# Patient Record
Sex: Female | Born: 1940
Health system: Southern US, Community
[De-identification: ages and names within clinical notes are randomized; demographics above are authoritative.]

## PROBLEM LIST (undated history)

## (undated) DIAGNOSIS — Z8701 Personal history of pneumonia (recurrent): Secondary | ICD-10-CM

## (undated) DIAGNOSIS — I10 Essential (primary) hypertension: Secondary | ICD-10-CM

## (undated) DIAGNOSIS — E559 Vitamin D deficiency, unspecified: Secondary | ICD-10-CM

## (undated) DIAGNOSIS — M8580 Other specified disorders of bone density and structure, unspecified site: Secondary | ICD-10-CM

## (undated) DIAGNOSIS — J45909 Unspecified asthma, uncomplicated: Secondary | ICD-10-CM

## (undated) DIAGNOSIS — G4700 Insomnia, unspecified: Secondary | ICD-10-CM

## (undated) DIAGNOSIS — N289 Disorder of kidney and ureter, unspecified: Secondary | ICD-10-CM

## (undated) DIAGNOSIS — M109 Gout, unspecified: Secondary | ICD-10-CM

## (undated) DIAGNOSIS — E7800 Pure hypercholesterolemia, unspecified: Secondary | ICD-10-CM

## (undated) DIAGNOSIS — G609 Hereditary and idiopathic neuropathy, unspecified: Secondary | ICD-10-CM

## (undated) HISTORY — DX: Gout, unspecified: M10.9

## (undated) HISTORY — DX: Vitamin D deficiency, unspecified: E55.9

## (undated) HISTORY — DX: Pure hypercholesterolemia, unspecified: E78.00

## (undated) HISTORY — DX: Personal history of pneumonia (recurrent): Z87.01

## (undated) HISTORY — PX: APPENDECTOMY: SHX54

## (undated) HISTORY — DX: Hereditary and idiopathic neuropathy, unspecified: G60.9

## (undated) HISTORY — DX: Essential (primary) hypertension: I10

## (undated) HISTORY — DX: Disorder of kidney and ureter, unspecified: N28.9

## (undated) HISTORY — DX: Unspecified asthma, uncomplicated: J45.909

## (undated) HISTORY — PX: TONSILLECTOMY: SUR1361

## (undated) HISTORY — DX: Other specified disorders of bone density and structure, unspecified site: M85.80

## (undated) HISTORY — DX: Insomnia, unspecified: G47.00

## (undated) HISTORY — PX: VESICOVAGINAL FISTULA CLOSURE W/ TAH: SUR271

---

## 2013-09-30 ENCOUNTER — Telehealth: Payer: Self-pay | Admitting: Critical Care Medicine

## 2013-09-30 NOTE — Telephone Encounter (Signed)
Pt has an appt w/ PW in [place] for 2/24 @ 3:45 PM.  Pt was referred by Dr. Lovette Cliche for Ongoing Upper Resp Probs.  Nothing further needed.  Nichole Gray

## 2013-10-03 ENCOUNTER — Institutional Professional Consult (permissible substitution): Payer: Self-pay | Admitting: Internal Medicine

## 2013-11-21 ENCOUNTER — Encounter: Payer: Self-pay | Admitting: Critical Care Medicine

## 2013-11-22 ENCOUNTER — Encounter: Payer: Self-pay | Admitting: Critical Care Medicine

## 2013-11-22 ENCOUNTER — Ambulatory Visit (INDEPENDENT_AMBULATORY_CARE_PROVIDER_SITE_OTHER): Payer: Self-pay | Admitting: Critical Care Medicine

## 2013-11-22 VITALS — BP 134/66 | HR 95 | Ht 65.0 in | Wt 137.2 lb

## 2013-11-22 DIAGNOSIS — M8580 Other specified disorders of bone density and structure, unspecified site: Secondary | ICD-10-CM | POA: Insufficient documentation

## 2013-11-22 DIAGNOSIS — R059 Cough, unspecified: Secondary | ICD-10-CM

## 2013-11-22 DIAGNOSIS — R918 Other nonspecific abnormal finding of lung field: Secondary | ICD-10-CM

## 2013-11-22 DIAGNOSIS — A310 Pulmonary mycobacterial infection: Secondary | ICD-10-CM

## 2013-11-22 DIAGNOSIS — E559 Vitamin D deficiency, unspecified: Secondary | ICD-10-CM | POA: Insufficient documentation

## 2013-11-22 DIAGNOSIS — R05 Cough: Secondary | ICD-10-CM

## 2013-11-22 DIAGNOSIS — N289 Disorder of kidney and ureter, unspecified: Secondary | ICD-10-CM | POA: Insufficient documentation

## 2013-11-22 DIAGNOSIS — I10 Essential (primary) hypertension: Secondary | ICD-10-CM | POA: Insufficient documentation

## 2013-11-22 DIAGNOSIS — E7800 Pure hypercholesterolemia, unspecified: Secondary | ICD-10-CM | POA: Insufficient documentation

## 2013-11-22 DIAGNOSIS — G609 Hereditary and idiopathic neuropathy, unspecified: Secondary | ICD-10-CM | POA: Insufficient documentation

## 2013-11-22 MED ORDER — ETHAMBUTOL HCL 400 MG PO TABS: ORAL_TABLET | ORAL | Status: DC

## 2013-11-22 MED ORDER — RIFAMPIN 300 MG PO CAPS: ORAL_CAPSULE | ORAL | Status: DC

## 2013-11-22 MED ORDER — CLARITHROMYCIN 500 MG PO TABS: ORAL_TABLET | ORAL | Status: DC

## 2013-11-22 NOTE — Patient Instructions (Signed)
Start Biaxin 500mg  Two three times weekly Start Rifabutin 300mg  Two three times weekly Start myambutol 400mg  Three three times weekly Return 6 weeks for recheck

## 2013-11-22 NOTE — Progress Notes (Signed)
Subjective:    Patient ID: Nichole Gray, female    DOB: [birth year], 73 y.o.   MRN: [id number]  HPI Comments: Chronic cough for two years.  Dx PNA, Flu, bronchitis.  Pt coughs up daily, this am particularly worse Notes dyspnea with any exertion.  Smoker 24yrs ago  Cough This is a chronic problem. The current episode started more than 1 year ago. The problem has been gradually worsening. The cough is productive of sputum (white to yellow mucus). Associated symptoms include chills, headaches, heartburn, hemoptysis, nasal congestion, postnasal drip, rhinorrhea and shortness of breath. Pertinent negatives include no chest pain, ear congestion, ear pain, fever, myalgias, rash, sore throat, sweats, weight loss or wheezing. The symptoms are aggravated by fumes, dust and lying down. She has tried a beta-agonist inhaler for the symptoms. The treatment provided no relief. Her past medical history is significant for asthma, bronchitis, environmental allergies and pneumonia. There is no history of bronchiectasis, COPD or emphysema.    Past Medical History  Diagnosis Date  . Elevated cholesterol   . Idiopathic neuropathy   . History of pneumonia   . Osteopenia   . Insomnia   . Gout   . Benign essential hypertension   . Renal insufficiency   . Vitamin D insufficiency   . Asthma      Family History  Problem Relation Age of Onset  . Coronary artery disease Mother   . Stroke    . Brain cancer Father   . Emphysema Father   . Rheum arthritis Sister   . Diabetes    . Heart disease Sister   . Breast cancer Sister      History   Social History  . Marital Status: N/A    Spouse Name: N/A    Number of Children: N/A  . Years of Education: N/A   Occupational History  . Not on file.   Social History Main Topics  . Smoking status: Former Smoker -- 0.50 packs/day for 10 years    Types: Cigarettes    Quit date: 08/25/1994  . Smokeless tobacco: Never Used  . Alcohol Use: No  . Drug Use: No   . Sexual Activity: Not on file   Other Topics Concern  . Not on file   Social History Narrative  . No narrative on file     Allergies  Allergen Reactions  . Codeine     Nausea, vomiting  . Hydrocodone     vomting  . Penicillins     Rash, itch  . Prednisone     Can tolerate medication but doesn't like taking it     Outpatient Prescriptions Prior to Visit  Medication Sig Dispense Refill  . allopurinol (ZYLOPRIM) 100 MG tablet Take 100 mg by mouth 2 (two) times daily.      Marland Kitchen estradiol (ESTRACE) 1 MG tablet Take 1 mg by mouth daily.      Marland Kitchen losartan (COZAAR) 50 MG tablet Take 50 mg by mouth daily.      Marland Kitchen omeprazole (PRILOSEC) 20 MG capsule Take 20 mg by mouth 2 (two) times daily.        No facility-administered medications prior to visit.      Review of Systems  Constitutional: Positive for chills and activity change. Negative for fever, weight loss, diaphoresis, appetite change, fatigue and unexpected weight change.  HENT: Positive for congestion, postnasal drip, rhinorrhea, sinus pressure, sneezing, trouble swallowing and voice change. Negative for dental problem, ear discharge, ear pain, facial swelling, hearing  loss, mouth sores, nosebleeds, sore throat and tinnitus.   Eyes: Negative for photophobia, discharge, itching and visual disturbance.  Respiratory: Positive for cough, hemoptysis and shortness of breath. Negative for apnea, choking, chest tightness, wheezing and stridor.   Cardiovascular: Positive for palpitations and leg swelling. Negative for chest pain.  Gastrointestinal: Positive for heartburn. Negative for nausea, vomiting, abdominal pain, constipation, blood in stool and abdominal distention.       Has heartburn daily  Genitourinary: Positive for frequency. Negative for dysuria, urgency, hematuria, flank pain, decreased urine volume and difficulty urinating.  Musculoskeletal: Positive for back pain and gait problem. Negative for arthralgias, joint swelling,  myalgias, neck pain and neck stiffness.  Skin: Negative for color change, pallor and rash.  Allergic/Immunologic: Positive for environmental allergies.  Neurological: Positive for numbness and headaches. Negative for dizziness, tremors, seizures, syncope, speech difficulty, weakness and light-headedness.  Hematological: Negative for adenopathy. Bruises/bleeds easily.  Psychiatric/Behavioral: Positive for sleep disturbance. Negative for confusion and agitation. The patient is not nervous/anxious.        Objective:   Physical Exam Filed Vitals:   11/22/13 1409  BP: 134/66  Pulse: 95  Height: 5\' 5"  (1.651 m)  Weight: 137 lb 3.2 oz (62.234 kg)  SpO2: 98%    Gen: Pleasant, well-nourished, in no distress,  normal affect  ENT: No lesions,  mouth clear,  oropharynx clear, no postnasal drip  Neck: No JVD, no TMG, no carotid bruits  Lungs: No use of accessory muscles, no dullness to percussion, scattered upper lobe rhonchi  Cardiovascular: RRR, heart sounds normal, no murmur or gallops, no peripheral edema  Abdomen: soft and NT, no HSM,  BS normal  Musculoskeletal: No deformities, no cyanosis or clubbing  Neuro: alert, non focal  Skin: Warm, no lesions or rashes  No results found. CT scan and all records from Dr chodri/hosp reviewed      Assessment & Plan:   Gout MAI on FOB from 08/2013.  Chronic upper lobe bilateral infiltrates with tree in bud pattern Pt is symptomatic and needs Rx Plan Start Biaxin 500mg  Two three times weekly Start Rifabutin 300mg  Two three times weekly Start myambutol 400mg  Three three times weekly Return 6 weeks for recheck    Cough Chronic cough d/t MAI pulm infection See MAI Rec   Updated Medication List Outpatient Encounter Prescriptions as of 11/22/2013  Medication Sig  . albuterol (PROVENTIL HFA;VENTOLIN HFA) 108 (90 BASE) MCG/ACT inhaler Inhale 2 puffs into the lungs 2 (two) times daily as needed for wheezing or shortness of breath.  .  allopurinol (ZYLOPRIM) 100 MG tablet Take 100 mg by mouth 2 (two) times daily.  Marland Kitchen estradiol (ESTRACE) 1 MG tablet Take 1 mg by mouth daily.  Marland Kitchen losartan (COZAAR) 50 MG tablet Take 50 mg by mouth daily.  Marland Kitchen omeprazole (PRILOSEC) 20 MG capsule Take 20 mg by mouth 2 (two) times daily.   . clarithromycin (BIAXIN) 500 MG tablet Two tablets three times weekly  . ethambutol (MYAMBUTOL) 400 MG tablet Three tablets three times weekly  . rifampin (RIFADIN) 300 MG capsule Take two capsules three times weekly

## 2013-11-24 NOTE — Assessment & Plan Note (Signed)
Chronic cough d/t MAI pulm infection See MAI Rec

## 2013-11-24 NOTE — Assessment & Plan Note (Signed)
MAI on FOB from 08/2013.  Chronic upper lobe bilateral infiltrates with tree in bud pattern Pt is symptomatic and needs Rx Plan Start Biaxin 500mg  Two three times weekly Start Rifabutin 300mg  Two three times weekly Start myambutol 400mg  Three three times weekly Return 6 weeks for recheck

## 2013-12-27 ENCOUNTER — Ambulatory Visit (INDEPENDENT_AMBULATORY_CARE_PROVIDER_SITE_OTHER): Payer: Self-pay | Admitting: Critical Care Medicine

## 2013-12-27 ENCOUNTER — Encounter: Payer: Self-pay | Admitting: Critical Care Medicine

## 2013-12-27 VITALS — BP 128/62 | HR 90 | Temp 97.6°F | Ht 65.5 in | Wt 137.0 lb

## 2013-12-27 DIAGNOSIS — R05 Cough: Secondary | ICD-10-CM

## 2013-12-27 DIAGNOSIS — R059 Cough, unspecified: Secondary | ICD-10-CM

## 2013-12-27 DIAGNOSIS — T50905S Adverse effect of unspecified drugs, medicaments and biological substances, sequela: Secondary | ICD-10-CM

## 2013-12-27 DIAGNOSIS — J479 Bronchiectasis, uncomplicated: Secondary | ICD-10-CM

## 2013-12-27 DIAGNOSIS — A310 Pulmonary mycobacterial infection: Secondary | ICD-10-CM

## 2013-12-27 NOTE — Assessment & Plan Note (Signed)
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

## 2013-12-27 NOTE — Assessment & Plan Note (Signed)
Cough on the basis of Mycobacterium infection now improved Monitor

## 2013-12-27 NOTE — Patient Instructions (Signed)
Stay on ethambutol/rifampin/biaxin as prescribed Get an eye exam to check retina/color vision.  Tell Eye doctor you are on Ethambutol Liver functions today Return 3 months Plan to stay on antibiotics minimum 15months

## 2013-12-27 NOTE — Progress Notes (Signed)
Subjective:    Patient ID: Nichole Gray, female    DOB: Sep 01, 1940, 73 y.o.   MRN: [id number]  HPI 12/27/2013 Chief Complaint  Patient presents with  . Follow-up    Feeling better , cough-white,no wheezing, denies cp or tightness,chills   At the last OV we Dx MAI lung, pos fob 08/2013 Rx rifabutin/Ethambutol/Biaxin Cough is now better.  Only early AM will cough.  tol meds well so far. Needs LFTs today No hemoptysis.  No chest pain or fever. Pt denies any significant sore throat, nasal congestion or excess secretions, fever, chills, sweats, unintended weight loss, pleurtic or exertional chest pain, orthopnea PND, or leg swelling Pt denies any increase in rescue therapy over baseline, denies waking up needing it or having any early am or nocturnal exacerbations of coughing/wheezing/or dyspnea. Pt also denies any obvious fluctuation in symptoms with  weather or environmental change or other alleviating or aggravating factors   Review of Systems  Constitutional: Positive for activity change. Negative for diaphoresis, appetite change, fatigue and unexpected weight change.  HENT: Positive for congestion, sinus pressure, sneezing, trouble swallowing and voice change. Negative for dental problem, ear discharge, facial swelling, hearing loss, mouth sores, nosebleeds and tinnitus.   Eyes: Negative for photophobia, discharge, itching and visual disturbance.  Respiratory: Negative for apnea, choking, chest tightness and stridor.   Cardiovascular: Positive for palpitations and leg swelling.  Gastrointestinal: Negative for nausea, vomiting, abdominal pain, constipation, blood in stool and abdominal distention.       Has heartburn daily  Genitourinary: Positive for frequency. Negative for dysuria, urgency, hematuria, flank pain, decreased urine volume and difficulty urinating.  Musculoskeletal: Positive for back pain and gait problem. Negative for arthralgias, joint swelling, neck pain and neck  stiffness.  Skin: Negative for color change and pallor.  Neurological: Positive for numbness. Negative for dizziness, tremors, seizures, syncope, speech difficulty, weakness and light-headedness.  Hematological: Negative for adenopathy. Bruises/bleeds easily.  Psychiatric/Behavioral: Positive for sleep disturbance. Negative for confusion and agitation. The patient is not nervous/anxious.        Objective:   Physical Exam  Filed Vitals:   12/27/13 1106  BP: 128/62  Pulse: 90  Temp: 97.6 F (36.4 C)  TempSrc: Oral  Height: 5' 5.5" (1.664 m)  Weight: 137 lb (62.143 kg)  SpO2: 98%    Gen: Pleasant, well-nourished, in no distress,  normal affect  ENT: No lesions,  mouth clear,  oropharynx clear, no postnasal drip  Neck: No JVD, no TMG, no carotid bruits  Lungs: No use of accessory muscles, no dullness to percussion, improved scattered upper lobe rhonchi  Cardiovascular: RRR, heart sounds normal, no murmur or gallops, no peripheral edema  Abdomen: soft and NT, no HSM,  BS normal  Musculoskeletal: No deformities, no cyanosis or clubbing  Neuro: alert, non focal  Skin: Warm, no lesions or rashes  No results found.     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 allopurinol (ZYLOPRIM) 100 MG tablet Take 100 mg by mouth 2 (two) times daily.  . clarithromycin (BIAXIN) 500 MG tablet Two tablets three times weekly  . estradiol (ESTRACE) 1 MG tablet Take 1  mg by mouth daily.  Marland Kitchen ethambutol (MYAMBUTOL) 400 MG tablet Three tablets three times weekly  . losartan  (COZAAR) 50 MG tablet Take 50 mg by mouth daily.  Marland Kitchen omeprazole (PRILOSEC) 20 MG capsule Take 20 mg by mouth 2 (two) times daily.   . rifampin (RIFADIN) 300 MG capsule Take two capsules three times weekly  . albuterol (PROVENTIL HFA;VENTOLIN HFA) 108 (90 BASE) MCG/ACT inhaler Inhale 2 puffs into the lungs 2 (two) times daily as needed for wheezing or shortness of breath.

## 2013-12-28 ENCOUNTER — Telehealth: Payer: Self-pay | Admitting: Critical Care Medicine

## 2013-12-28 NOTE — Telephone Encounter (Signed)
PW ordered liver function test yesterday during [facility] for this pt.  Lab was done at [facility] on 12/27/13. We received these results. Per PW: Call pt and tell her labs are ok.  Called, spoke with pt. Informed her of lab results per Dr. Joya Gaskins.  She verbalized understanding and voiced no further questions or concerns at this time.  Results placed in PW's scan folder.

## 2014-04-12 ENCOUNTER — Telehealth: Payer: Self-pay | Admitting: Critical Care Medicine

## 2014-04-12 MED ORDER — RIFAMPIN 300 MG PO CAPS: ORAL_CAPSULE | ORAL | Status: DC

## 2014-04-12 NOTE — Telephone Encounter (Signed)
Called and spoke with pt and she is aware of refill that has been sent to the pharmacy and nothing further is needed.

## 2014-04-26 ENCOUNTER — Encounter: Payer: Self-pay | Admitting: Critical Care Medicine

## 2014-05-02 ENCOUNTER — Encounter: Payer: Self-pay | Admitting: Critical Care Medicine

## 2014-05-02 ENCOUNTER — Ambulatory Visit (INDEPENDENT_AMBULATORY_CARE_PROVIDER_SITE_OTHER): Payer: Self-pay | Admitting: Critical Care Medicine

## 2014-05-02 VITALS — BP 132/60 | HR 78 | Temp 98.4°F | Ht 65.5 in | Wt 134.4 lb

## 2014-05-02 DIAGNOSIS — A310 Pulmonary mycobacterial infection: Secondary | ICD-10-CM

## 2014-05-02 DIAGNOSIS — T50905D Adverse effect of unspecified drugs, medicaments and biological substances, subsequent encounter: Secondary | ICD-10-CM

## 2014-05-02 NOTE — Assessment & Plan Note (Signed)
Mycobacterium avium intracellulare positive on Fob 08/2013, pos response to thrice weekly rifampin/biaxin/ethambutal Plan Cont same until 07/2014 then stop Liver function studies 05/02/2014

## 2014-05-02 NOTE — Patient Instructions (Signed)
Lab today: liver function panel No change in medications Please obtain a flu vaccine this fall Return 3  months

## 2014-05-02 NOTE — Progress Notes (Signed)
Subjective:    Patient ID: Nichole Gray, female    DOB: Jul 24, 1941, 73 y.o.   MRN: [id number]  HPI  05/02/2014 Chief Complaint  Patient presents with  . Follow-up    Feeling much better and cough has improved.  Has an occas cough with white mucus.  No SOB or chest tightness/pain.  Cough and breathing is better.  occ white mucus. No f/c/s. No chest pain. No night sweats Pt denies any significant sore throat, nasal congestion or excess secretions, fever, chills, sweats, unintended weight loss, pleurtic or exertional chest pain, orthopnea PND, or leg swelling Pt denies any increase in rescue therapy over baseline, denies waking up needing it or having any early am or nocturnal exacerbations of coughing/wheezing/or dyspnea. Pt also denies any obvious fluctuation in symptoms with  weather or environmental change or other alleviating or aggravating factors    Review of Systems  Constitutional: Positive for activity change. Negative for diaphoresis, appetite change, fatigue and unexpected weight change.  HENT: Positive for sinus pressure, sneezing, trouble swallowing and voice change. Negative for congestion, dental problem, ear discharge, facial swelling, hearing loss, mouth sores, nosebleeds and tinnitus.   Eyes: Negative for photophobia, discharge, itching and visual disturbance.  Respiratory: Negative for apnea, choking, chest tightness and stridor.   Cardiovascular: Positive for palpitations and leg swelling.  Gastrointestinal: Negative for nausea, vomiting, abdominal pain, constipation, blood in stool and abdominal distention.       Has heartburn daily  Genitourinary: Positive for frequency. Negative for dysuria, urgency, hematuria, flank pain, decreased urine volume and difficulty urinating.  Musculoskeletal: Positive for back pain and gait problem. Negative for arthralgias, joint swelling, neck pain and neck stiffness.  Skin: Negative for color change and pallor.  Neurological:  Positive for numbness. Negative for dizziness, tremors, seizures, syncope, speech difficulty, weakness and light-headedness.  Hematological: Negative for adenopathy. Bruises/bleeds easily.  Psychiatric/Behavioral: Positive for sleep disturbance. Negative for confusion and agitation. The patient is not nervous/anxious.        Objective:   Physical Exam  Filed Vitals:   05/02/14 1516  BP: 132/60  Pulse: 78  Temp: 98.4 F (36.9 C)  TempSrc: Oral  Height: 5' 5.5" (1.664 m)  Weight: 134 lb 6.4 oz (60.963 kg)  SpO2: 95%    Gen: Pleasant, well-nourished, in no distress,  normal affect  ENT: No lesions,  mouth clear,  oropharynx clear, no postnasal drip  Neck: No JVD, no TMG, no carotid bruits  Lungs: No use of accessory muscles, no dullness to percussion, improved scattered upper lobe rhonchi  Cardiovascular: RRR, heart sounds normal, no murmur or gallops, no peripheral edema  Abdomen: soft and NT, no HSM,  BS normal  Musculoskeletal: No deformities, no cyanosis or clubbing  Neuro: alert, non focal  Skin: Warm, no lesions or rashes  No results found.     Assessment & Plan:   MAI (mycobacterium avium-intracellulare) infection Mycobacterium avium intracellulare positive on Fob 08/2013, pos response to thrice weekly rifampin/biaxin/ethambutal Plan Cont same until 07/2014 then stop Liver function studies 05/02/2014     Updated Medication List Outpatient Encounter Prescriptions as of 05/02/2014  Medication Sig  . allopurinol (ZYLOPRIM) 100 MG tablet Take 100 mg by mouth 2 (two) times daily.  . clarithromycin (BIAXIN) 500 MG tablet Two tablets three times weekly  . estradiol (ESTRACE) 1 MG tablet Take 1 mg by mouth daily.  Marland Kitchen ethambutol (MYAMBUTOL) 400 MG tablet Three tablets three times weekly  . losartan (COZAAR) 50 MG tablet Take 50  mg by mouth daily.  Marland Kitchen MAGNESIUM PO Take 1 tablet by mouth daily.  Marland Kitchen omeprazole (PRILOSEC) 20 MG capsule Take 20 mg by mouth 2 (two) times  daily.   . rifampin (RIFADIN) 300 MG capsule Take two capsules three times weekly  . [DISCONTINUED] albuterol (PROVENTIL HFA;VENTOLIN HFA) 108 (90 BASE) MCG/ACT inhaler Inhale 2 puffs into the lungs 2 (two) times daily as needed for wheezing or shortness of breath.

## 2014-05-19 ENCOUNTER — Telehealth: Payer: Self-pay | Admitting: Critical Care Medicine

## 2014-05-19 NOTE — Telephone Encounter (Signed)
Dr. Joya Gaskins ordered LFTs to be done at [facility] during last OV. LFTs done on 05/02/14. Results received.  Per Dr. Joya Gaskins, tell pt LFTs are ok.  Called, spoke with pt.  Informed her of lab results per Dr. Joya Gaskins.  She verbalized understanding and voiced no further questions or concerns at this time.  Results placed in scan folder.

## 2014-06-01 ENCOUNTER — Encounter: Payer: Self-pay | Admitting: Critical Care Medicine

## 2014-06-20 ENCOUNTER — Telehealth: Payer: Self-pay | Admitting: Critical Care Medicine

## 2014-06-20 MED ORDER — CLARITHROMYCIN 500 MG PO TABS: ORAL_TABLET | ORAL | Status: DC

## 2014-06-20 NOTE — Telephone Encounter (Signed)
Spoke with the pt to verify the msg  Rx was sent to pharm  Nothing further needed

## 2014-07-24 ENCOUNTER — Telehealth: Payer: Self-pay | Admitting: Critical Care Medicine

## 2014-07-24 NOTE — Telephone Encounter (Signed)
Per last OV 04/2014  pt was advised to follow-up in 3 months [facility]. There are no available appts? Please advise. [place] Bing, CMA

## 2014-07-25 ENCOUNTER — Ambulatory Visit (INDEPENDENT_AMBULATORY_CARE_PROVIDER_SITE_OTHER): Payer: Self-pay | Admitting: Critical Care Medicine

## 2014-07-25 ENCOUNTER — Encounter: Payer: Self-pay | Admitting: Critical Care Medicine

## 2014-07-25 ENCOUNTER — Telehealth: Payer: Self-pay | Admitting: Critical Care Medicine

## 2014-07-25 VITALS — BP 136/60 | HR 85 | Temp 97.7°F | Ht 66.0 in | Wt 132.2 lb

## 2014-07-25 DIAGNOSIS — A310 Pulmonary mycobacterial infection: Secondary | ICD-10-CM

## 2014-07-25 DIAGNOSIS — Z5181 Encounter for therapeutic drug level monitoring: Secondary | ICD-10-CM

## 2014-07-25 NOTE — Telephone Encounter (Signed)
Spoke with pt.  Appt scheduled with PW for today at 11:15 am in [place].  Pt confirmed appt and voiced no further questions or concerns at this time.

## 2014-07-25 NOTE — Progress Notes (Signed)
Subjective:    Patient ID: Nichole Gray, female    DOB: April 03, 1941, 73 y.o.   MRN: [id number]  HPI 07/25/2014 Chief Complaint  Patient presents with  . 3 month follow up    Feels breathing is at baseline and is doing well.  Has occas DOE.  No cough, wheezing, or chest tightness/pain.    No real cough and feels better. No real wheeze  The patient overall is markedly better since starting thrice weekly antibiotic therapy for Mycobacterium Pt denies any significant sore throat, nasal congestion or excess secretions, fever, chills, sweats, unintended weight loss, pleurtic or exertional chest pain, orthopnea PND, or leg swelling Pt denies any increase in rescue therapy over baseline, denies waking up needing it or having any early am or nocturnal exacerbations of coughing/wheezing/or dyspnea. Pt also denies any obvious fluctuation in symptoms with  weather or environmental change or other alleviating or aggravating factors    Review of Systems  Constitutional: Positive for activity change. Negative for diaphoresis, appetite change, fatigue and unexpected weight change.  HENT: Positive for sinus pressure, sneezing and trouble swallowing. Negative for congestion, dental problem, ear discharge, facial swelling, hearing loss, mouth sores, nosebleeds, tinnitus and voice change.   Eyes: Negative for photophobia, discharge, itching and visual disturbance.  Respiratory: Negative for apnea, cough, choking, chest tightness, shortness of breath and stridor.   Cardiovascular: Positive for palpitations. Negative for leg swelling.  Gastrointestinal: Negative for nausea, vomiting, abdominal pain, constipation, blood in stool and abdominal distention.       Has heartburn daily  Genitourinary: Positive for frequency. Negative for dysuria, urgency, hematuria, flank pain, decreased urine volume and difficulty urinating.  Musculoskeletal: Positive for back pain and gait problem. Negative for joint swelling,  arthralgias, neck pain and neck stiffness.  Skin: Negative for color change and pallor.  Neurological: Positive for numbness. Negative for dizziness, tremors, seizures, syncope, speech difficulty, weakness and light-headedness.  Hematological: Negative for adenopathy. Bruises/bleeds easily.  Psychiatric/Behavioral: Positive for sleep disturbance. Negative for confusion and agitation. The patient is not nervous/anxious.        Objective:   Physical Exam Filed Vitals:   07/25/14 1105  BP: 136/60  Pulse: 85  Temp: 97.7 F (36.5 C)  TempSrc: Oral  Height: 5\' 6"  (1.676 m)  Weight: 132 lb 3.2 oz (59.966 kg)  SpO2: 96%    Gen: Pleasant, well-nourished, in no distress,  normal affect  ENT: No lesions,  mouth clear,  oropharynx clear, no postnasal drip  Neck: No JVD, no TMG, no carotid bruits  Lungs: No use of accessory muscles, no dullness to percussion, improved scattered upper lobe rhonchi  Cardiovascular: RRR, heart sounds normal, no murmur or gallops, no peripheral edema  Abdomen: soft and NT, no HSM,  BS normal  Musculoskeletal: No deformities, no cyanosis or clubbing  Neuro: alert, non focal  Skin: Warm, no lesions or rashes  No results found.     Assessment & Plan:   MAI (mycobacterium avium-intracellulare) infection Mycobacterium avium intracellular infection along with bronchiectasis and tree-in-bud pattern bilateral upper lung zones Improved with treatment using thrice weekly rifampin, mannitol, and Biaxin with plans for 9 months of therapy to end 09/24/2014 We will obtain liver function profile    Updated Medication List Outpatient Encounter Prescriptions as of 07/25/2014  Medication Sig  . allopurinol (ZYLOPRIM) 100 MG tablet Take 100 mg by mouth 2 (two) times daily.  . clarithromycin (BIAXIN) 500 MG tablet Two tablets three times weekly  . estradiol (ESTRACE) 1  MG tablet Take 1 mg by mouth daily.  Marland Kitchen ethambutol (MYAMBUTOL) 400 MG tablet Three tablets three  times weekly  . losartan (COZAAR) 50 MG tablet Take 50 mg by mouth daily.  Marland Kitchen omeprazole (PRILOSEC) 20 MG capsule Take 20 mg by mouth 2 (two) times daily.   . rifampin (RIFADIN) 300 MG capsule Take two capsules three times weekly  . [DISCONTINUED] MAGNESIUM PO Take 1 tablet by mouth daily.

## 2014-07-25 NOTE — Patient Instructions (Signed)
Plan to finish antibiotics end of January Labs today: liver function and CBC Return February for followup

## 2014-07-25 NOTE — Telephone Encounter (Signed)
Error

## 2014-07-26 NOTE — Assessment & Plan Note (Signed)
Mycobacterium avium intracellular infection along with bronchiectasis and tree-in-bud pattern bilateral upper lung zones Improved with treatment using thrice weekly rifampin, mannitol, and Biaxin with plans for 9 months of therapy to end 09/24/2014 We will obtain liver function profile

## 2014-07-27 ENCOUNTER — Telehealth: Payer: Self-pay | Admitting: Critical Care Medicine

## 2014-07-27 DIAGNOSIS — A310 Pulmonary mycobacterial infection: Secondary | ICD-10-CM

## 2014-07-27 NOTE — Telephone Encounter (Signed)
Let pt know LFTs/labs all normal No change in medications

## 2014-07-31 NOTE — Telephone Encounter (Signed)
Spoke with pt.  Discussed lab results and recs per Dr. Joya Gaskins.  She verbalized understanding and voiced no further questions or concerns at this time.

## 2014-08-31 DIAGNOSIS — D510 Vitamin B12 deficiency anemia due to intrinsic factor deficiency: Secondary | ICD-10-CM | POA: Diagnosis not present

## 2014-09-07 DIAGNOSIS — D510 Vitamin B12 deficiency anemia due to intrinsic factor deficiency: Secondary | ICD-10-CM | POA: Diagnosis not present

## 2014-09-14 DIAGNOSIS — D510 Vitamin B12 deficiency anemia due to intrinsic factor deficiency: Secondary | ICD-10-CM | POA: Diagnosis not present

## 2014-09-19 DIAGNOSIS — G2581 Restless legs syndrome: Secondary | ICD-10-CM | POA: Diagnosis not present

## 2014-09-19 DIAGNOSIS — G629 Polyneuropathy, unspecified: Secondary | ICD-10-CM | POA: Diagnosis not present

## 2014-10-12 DIAGNOSIS — M5416 Radiculopathy, lumbar region: Secondary | ICD-10-CM | POA: Diagnosis not present

## 2014-10-17 DIAGNOSIS — D510 Vitamin B12 deficiency anemia due to intrinsic factor deficiency: Secondary | ICD-10-CM | POA: Diagnosis not present

## 2014-10-17 DIAGNOSIS — M5416 Radiculopathy, lumbar region: Secondary | ICD-10-CM | POA: Insufficient documentation

## 2014-10-17 DIAGNOSIS — G2581 Restless legs syndrome: Secondary | ICD-10-CM | POA: Diagnosis not present

## 2014-10-17 DIAGNOSIS — E538 Deficiency of other specified B group vitamins: Secondary | ICD-10-CM | POA: Insufficient documentation

## 2014-10-26 DIAGNOSIS — E559 Vitamin D deficiency, unspecified: Secondary | ICD-10-CM | POA: Diagnosis not present

## 2014-10-26 DIAGNOSIS — M8580 Other specified disorders of bone density and structure, unspecified site: Secondary | ICD-10-CM | POA: Diagnosis not present

## 2014-10-26 DIAGNOSIS — Z1389 Encounter for screening for other disorder: Secondary | ICD-10-CM | POA: Diagnosis not present

## 2014-10-26 DIAGNOSIS — G609 Hereditary and idiopathic neuropathy, unspecified: Secondary | ICD-10-CM | POA: Diagnosis not present

## 2014-11-13 ENCOUNTER — Telehealth: Payer: Self-pay | Admitting: Critical Care Medicine

## 2014-11-13 NOTE — Telephone Encounter (Signed)
Pt requesting appt to follow up with PW in [place] Pt had recall in system to be scheduled for OV with PW in March 2016 - pt states that she never heard anything from our office. It does not look like pt was ever called to schedule.  Pt is requesting an appt ASAP with Dr Joya Gaskins in [place]. Aware that 5/31 is the first available, pt is not wanting to wait this long to schedule. Pt states that she was supposed to be called and never was and feels that she should not have to wait until 5/31 to see Dr Joya Gaskins when this was not her fault.   Crystal please advise on appt date/time for . Thanks.

## 2014-11-14 NOTE — Telephone Encounter (Signed)
Spoke with pt.  She is scheduled to see Dr. Joya Gaskins March 29 at 9:30 am in [place].  Pt confirmed appt date, time, and location and voiced no further questions or concerns at this time.

## 2014-11-15 DIAGNOSIS — J209 Acute bronchitis, unspecified: Secondary | ICD-10-CM | POA: Diagnosis not present

## 2014-11-15 DIAGNOSIS — D510 Vitamin B12 deficiency anemia due to intrinsic factor deficiency: Secondary | ICD-10-CM | POA: Diagnosis not present

## 2014-11-15 DIAGNOSIS — Z1389 Encounter for screening for other disorder: Secondary | ICD-10-CM | POA: Diagnosis not present

## 2014-11-21 ENCOUNTER — Ambulatory Visit (INDEPENDENT_AMBULATORY_CARE_PROVIDER_SITE_OTHER): Payer: Self-pay | Admitting: Critical Care Medicine

## 2014-11-21 ENCOUNTER — Encounter: Payer: Self-pay | Admitting: Critical Care Medicine

## 2014-11-21 DIAGNOSIS — A310 Pulmonary mycobacterial infection: Secondary | ICD-10-CM | POA: Diagnosis not present

## 2014-11-21 DIAGNOSIS — J479 Bronchiectasis, uncomplicated: Secondary | ICD-10-CM | POA: Diagnosis not present

## 2014-11-21 DIAGNOSIS — J984 Other disorders of lung: Secondary | ICD-10-CM | POA: Diagnosis not present

## 2014-11-21 NOTE — Progress Notes (Signed)
Subjective:    Patient ID: Nichole Gray, female    DOB: 1941-06-01, 74 y.o.   MRN: [id number]  HPI  11/21/2014 Chief Complaint  Patient presents with  . Follow-up    c/o had cold x 3 wks.,nasal congestion,able to get mucus up white, occass. dry cough,no fcs,hoarseness,no sorethroat,pnd,denies cp or tightness  MAC f/u on Rx.  Uri x 3 weeks.  Notes some nasal congestion.  Cough prod white mucus, was green. No ABX rx.  Pt saw PCP.  Notes some pndrip.  Pt on nasal steroid.  No f/c/s.   Prior to this was doing well and no issues. Pt denies any significant sore throat, nasal congestion or excess secretions, fever, chills, sweats, unintended weight loss, pleurtic or exertional chest pain, orthopnea PND, or leg swelling Pt denies any increase in rescue therapy over baseline, denies waking up needing it or having any early am or nocturnal exacerbations of coughing/wheezing/or dyspnea. Pt also denies any obvious fluctuation in symptoms with  weather or environmental change or other alleviating or aggravating factors  Review of Systems  Constitutional: Positive for activity change. Negative for diaphoresis, appetite change, fatigue and unexpected weight change.  HENT: Positive for postnasal drip, rhinorrhea, sneezing and sore throat. Negative for congestion, dental problem, ear discharge, facial swelling, hearing loss, mouth sores, nosebleeds, sinus pressure, tinnitus, trouble swallowing and voice change.   Eyes: Negative for photophobia, discharge, itching and visual disturbance.  Respiratory: Positive for cough and shortness of breath. Negative for apnea, choking, chest tightness and stridor.   Cardiovascular: Positive for palpitations. Negative for leg swelling.  Gastrointestinal: Negative for nausea, vomiting, abdominal pain, constipation, blood in stool and abdominal distention.       Has heartburn daily  Genitourinary: Positive for frequency. Negative for dysuria, urgency, hematuria, flank  pain, decreased urine volume and difficulty urinating.  Musculoskeletal: Positive for back pain and gait problem. Negative for joint swelling, arthralgias, neck pain and neck stiffness.  Skin: Negative for color change and pallor.  Neurological: Positive for numbness. Negative for dizziness, tremors, seizures, syncope, speech difficulty, weakness and light-headedness.  Hematological: Negative for adenopathy. Bruises/bleeds easily.  Psychiatric/Behavioral: Positive for sleep disturbance. Negative for confusion and agitation. The patient is not nervous/anxious.        Objective:   Physical Exam Filed Vitals:   11/21/14 0941  BP: 132/60  Pulse: 68  Temp: 96.1 F (35.6 C)  TempSrc: Oral  Height: 5\' 5"  (1.651 m)  Weight: 137 lb 12.8 oz (62.506 kg)  SpO2: 95%    Gen: Pleasant, well-nourished, in no distress,  normal affect  ENT: No lesions,  mouth clear,  oropharynx clear, +++ postnasal drip  Neck: No JVD, no TMG, no carotid bruits  Lungs: No use of accessory muscles, no dullness to percussion,no  rhonchi  Cardiovascular: RRR, heart sounds normal, no murmur or gallops, no peripheral edema  Abdomen: soft and NT, no HSM,  BS normal  Musculoskeletal: No deformities, no cyanosis or clubbing  Neuro: alert, non focal  Skin: Warm, no lesions or rashes  No results found.     Assessment & Plan:   MAI (mycobacterium avium-intracellulare) infection History of Mycobacterium avium infection with status post 9 months of therapy with thrice weekly rifampin Myambutol and Biaxin which ended December 2015 The patient currently is stable from a pulmonary standpoint The patient is getting over an intercurrent upper respiratory tract infection with associated springtime allergy flare There is no evidence for active Mycobacterium infection on physical exam at this  visit Plan Recheck CT scan of chest without contrast in comparison to December 2014 study Hold off on further antibiotic therapy  for Mycobacterium at this time     Updated Medication List Outpatient Encounter Prescriptions as of 11/21/2014  Medication Sig  . allopurinol (ZYLOPRIM) 100 MG tablet Take 100 mg by mouth 2 (two) times daily.  . benzonatate (TESSALON) 100 MG capsule Take by mouth 2 (two) times daily.  . cholecalciferol (VITAMIN D) 1000 UNITS tablet Take 1,000 Units by mouth. Take 1 tablet two times a week.  . estradiol (ESTRACE) 1 MG tablet Take 1 mg by mouth daily.  . fluticasone (FLONASE) 50 MCG/ACT nasal spray Place 2 sprays into both nostrils daily.  Marland Kitchen gabapentin (NEURONTIN) 300 MG capsule Take 300 mg by mouth. Take 1 tablet at bedtime.  Marland Kitchen losartan (COZAAR) 50 MG tablet Take 50 mg by mouth daily.  Marland Kitchen omeprazole (PRILOSEC) 20 MG capsule Take 20 mg by mouth 2 (two) times daily.   . [DISCONTINUED] clarithromycin (BIAXIN) 500 MG tablet Two tablets three times weekly (Patient not taking: Reported on 11/21/2014)  . [DISCONTINUED] ethambutol (MYAMBUTOL) 400 MG tablet Three tablets three times weekly (Patient not taking: Reported on 11/21/2014)  . [DISCONTINUED] rifampin (RIFADIN) 300 MG capsule Take two capsules three times weekly (Patient not taking: Reported on 11/21/2014)

## 2014-11-21 NOTE — Patient Instructions (Addendum)
No change in medications A repeat CT Chest will be obtained, we will call results Return 3 months

## 2014-11-21 NOTE — Addendum Note (Signed)
Addended by: Raymondo Band D on: 11/21/2014 10:13 AM   Modules accepted: Medications

## 2014-11-21 NOTE — Assessment & Plan Note (Signed)
History of Mycobacterium avium infection with status post 9 months of therapy with thrice weekly rifampin Myambutol and Biaxin which ended December 2015 The patient currently is stable from a pulmonary standpoint The patient is getting over an intercurrent upper respiratory tract infection with associated springtime allergy flare There is no evidence for active Mycobacterium infection on physical exam at this visit Plan Recheck CT scan of chest without contrast in comparison to December 2014 study Hold off on further antibiotic therapy for Mycobacterium at this time

## 2014-11-27 ENCOUNTER — Telehealth: Payer: Self-pay | Admitting: Critical Care Medicine

## 2014-11-27 DIAGNOSIS — A310 Pulmonary mycobacterial infection: Secondary | ICD-10-CM

## 2014-11-27 MED ORDER — ETHAMBUTOL HCL 400 MG PO TABS: ORAL_TABLET | ORAL | Status: DC

## 2014-11-27 MED ORDER — RIFAMPIN 300 MG PO CAPS: ORAL_CAPSULE | ORAL | Status: DC

## 2014-11-27 MED ORDER — AZITHROMYCIN 250 MG PO TABS: ORAL_TABLET | ORAL | Status: DC

## 2014-11-27 NOTE — Telephone Encounter (Signed)
Call in to pts pharmacy:  Myambutol 400mg : three , three times weekly Rifampin 300mg : two , three times weekly Azithromycin 250mg  : Two, three times weekly  Pt aware of CT Results and need to resume MAC drugs

## 2014-11-27 NOTE — Telephone Encounter (Signed)
Rxs sent to [facility].  Pt aware and voiced no further questions or concerns at this time.

## 2014-11-28 DIAGNOSIS — M5416 Radiculopathy, lumbar region: Secondary | ICD-10-CM | POA: Diagnosis not present

## 2014-11-28 DIAGNOSIS — G2581 Restless legs syndrome: Secondary | ICD-10-CM | POA: Diagnosis not present

## 2014-11-28 DIAGNOSIS — E538 Deficiency of other specified B group vitamins: Secondary | ICD-10-CM | POA: Diagnosis not present

## 2014-12-14 DIAGNOSIS — D510 Vitamin B12 deficiency anemia due to intrinsic factor deficiency: Secondary | ICD-10-CM | POA: Diagnosis not present

## 2014-12-20 ENCOUNTER — Other Ambulatory Visit: Payer: Self-pay | Admitting: *Deleted

## 2014-12-20 MED ORDER — AZITHROMYCIN 250 MG PO TABS: ORAL_TABLET | ORAL | Status: DC

## 2014-12-20 MED ORDER — RIFAMPIN 300 MG PO CAPS: ORAL_CAPSULE | ORAL | Status: DC

## 2014-12-20 MED ORDER — ETHAMBUTOL HCL 400 MG PO TABS
ORAL_TABLET | ORAL | Status: DC
Start: 2014-12-20 — End: 2021-12-21

## 2014-12-21 ENCOUNTER — Encounter: Payer: Self-pay | Admitting: Critical Care Medicine

## 2014-12-21 DIAGNOSIS — Z79899 Other long term (current) drug therapy: Secondary | ICD-10-CM | POA: Diagnosis not present

## 2014-12-21 DIAGNOSIS — E559 Vitamin D deficiency, unspecified: Secondary | ICD-10-CM | POA: Diagnosis not present

## 2014-12-21 DIAGNOSIS — M109 Gout, unspecified: Secondary | ICD-10-CM | POA: Diagnosis not present

## 2015-01-15 DIAGNOSIS — D510 Vitamin B12 deficiency anemia due to intrinsic factor deficiency: Secondary | ICD-10-CM | POA: Diagnosis not present

## 2015-02-13 DIAGNOSIS — D510 Vitamin B12 deficiency anemia due to intrinsic factor deficiency: Secondary | ICD-10-CM | POA: Diagnosis not present

## 2015-02-20 ENCOUNTER — Ambulatory Visit (INDEPENDENT_AMBULATORY_CARE_PROVIDER_SITE_OTHER): Payer: Self-pay | Admitting: Critical Care Medicine

## 2015-02-20 ENCOUNTER — Encounter: Payer: Self-pay | Admitting: Critical Care Medicine

## 2015-02-20 VITALS — BP 134/62 | HR 71 | Temp 99.4°F | Ht 65.5 in | Wt 142.0 lb

## 2015-02-20 DIAGNOSIS — A310 Pulmonary mycobacterial infection: Secondary | ICD-10-CM | POA: Diagnosis not present

## 2015-02-20 NOTE — Patient Instructions (Addendum)
No change in medications. Return in      3 months Records from Dr Lin Landsman will be obtained (recent lab work)

## 2015-02-20 NOTE — Progress Notes (Signed)
   Subjective:    Patient ID: Nichole Gray, female    DOB: 12/29/1940, 74 y.o.   MRN: [id number]  HPI 02/20/2015 Chief Complaint  Patient presents with  . 3 month follow up    Breathing doing well overall.  Minimal cough.  Prod in the mornings with small of white mucus.  No wheezing, chest tightness, or CP.   Min cough, no real dyspnea.  No f/c/s.  No chest pain.  Pt denies any significant sore throat, nasal congestion or excess secretions, fever, chills, sweats, unintended weight loss, pleurtic or exertional chest pain, orthopnea PND, or leg swelling Pt denies any increase in rescue therapy over baseline, denies waking up needing it or having any early am or nocturnal exacerbations of coughing/wheezing/or dyspnea. Pt also denies any obvious fluctuation in symptoms with  weather or environmental change or other alleviating or aggravating factors   Current Medications, Allergies, Complete Past Medical History, Past Surgical History, Family History, and Social History were reviewed in [facility] record per todays encounter:  02/20/2015  Review of Systems  Constitutional: Negative.   HENT: Negative.  Negative for ear pain, postnasal drip, rhinorrhea, sinus pressure, sore throat, trouble swallowing and voice change.   Eyes: Negative.   Respiratory: Positive for cough. Negative for apnea, choking, chest tightness, shortness of breath, wheezing and stridor.   Cardiovascular: Negative.  Negative for chest pain, palpitations and leg swelling.  Gastrointestinal: Negative.  Negative for nausea, vomiting, abdominal pain and abdominal distention.  Genitourinary: Negative.   Musculoskeletal: Negative.  Negative for myalgias and arthralgias.  Skin: Negative.  Negative for rash.  Allergic/Immunologic: Negative.  Negative for environmental allergies and food allergies.  Neurological: Negative.  Negative for dizziness, syncope, weakness and headaches.  Hematological: Negative.   Negative for adenopathy. Does not bruise/bleed easily.  Psychiatric/Behavioral: Negative.  Negative for sleep disturbance and agitation. The patient is not nervous/anxious.        Objective:   Physical Exam Filed Vitals:   02/20/15 1140  BP: 134/62  Pulse: 71  Temp: 99.4 F (37.4 C)  TempSrc: Oral  Height: 5' 5.5" (1.664 m)  Weight: 142 lb (64.411 kg)  SpO2: 95%    Gen: Pleasant, well-nourished, in no distress,  normal affect  ENT: No lesions,  mouth clear,  oropharynx clear, no postnasal drip  Neck: No JVD, no TMG, no carotid bruits  Lungs: No use of accessory muscles, no dullness to percussion, clear without rales or rhonchi  Cardiovascular: RRR, heart sounds normal, no murmur or gallops, no peripheral edema  Abdomen: soft and NT, no HSM,  BS normal  Musculoskeletal: No deformities, no cyanosis or clubbing  Neuro: alert, non focal  Skin: Warm, no lesions or rashes  No results found.       Assessment & Plan:  I personally reviewed all images and lab data in the [facility] system as well as any outside material available during this office visit and agree with the  radiology impressions.   MAI (mycobacterium avium-intracellulare) infection Hx of MAI on therapy and tol well Plan Cont three drug Rx for MAI F/u CT scan end of summer

## 2015-02-21 NOTE — Assessment & Plan Note (Signed)
Hx of MAI on therapy and tol well Plan Cont three drug Rx for MAI F/u CT scan end of summer

## 2015-03-16 DIAGNOSIS — D510 Vitamin B12 deficiency anemia due to intrinsic factor deficiency: Secondary | ICD-10-CM | POA: Diagnosis not present

## 2015-04-16 ENCOUNTER — Telehealth: Payer: Self-pay | Admitting: Critical Care Medicine

## 2015-04-16 NOTE — Telephone Encounter (Signed)
Patient would like an appointment with Dr. Joya Gaskins in [place] office in September.  Patient has Recall for 9/26, but there are no openings.  Crystal, where can we add patient?  Please advise.

## 2015-04-16 NOTE — Telephone Encounter (Signed)
Pt scheduled to see Dr. Joya Gaskins on Sept 6, 2016 at 2:45 pm in [place].  Pt confirmed appt and voiced no further questions or concerns at this time.

## 2015-04-18 DIAGNOSIS — D510 Vitamin B12 deficiency anemia due to intrinsic factor deficiency: Secondary | ICD-10-CM | POA: Diagnosis not present

## 2015-05-01 ENCOUNTER — Ambulatory Visit (INDEPENDENT_AMBULATORY_CARE_PROVIDER_SITE_OTHER): Payer: Self-pay | Admitting: Critical Care Medicine

## 2015-05-01 ENCOUNTER — Encounter: Payer: Self-pay | Admitting: Critical Care Medicine

## 2015-05-01 VITALS — BP 132/64 | HR 63 | Temp 97.1°F | Ht 65.5 in | Wt 142.8 lb

## 2015-05-01 DIAGNOSIS — A310 Pulmonary mycobacterial infection: Secondary | ICD-10-CM

## 2015-05-01 NOTE — Patient Instructions (Signed)
Stop antibiotics end of this month Obtain CT of Chest week of 05/21/15 Dr Halford Chessman will see you again in November

## 2015-05-01 NOTE — Assessment & Plan Note (Signed)
MAI with persistent tree in bud infiltrates and bronchiectasis in Left lingula and RML LLL.  Pos Fob for MAI 08/2013.  Not cultured again recently.  MAC Rx : AZTH/RIF/ETH three times weekly since 11/2014.  Symptoms minimal at present Plan Repeat CT Chest and compare to 10/2014 study Plan STOP three drug Rx end of Sept 2016 No other changes

## 2015-05-01 NOTE — Progress Notes (Signed)
Subjective:    Patient ID: Nichole Gray, female    DOB: [birth year], 74 y.o.   MRN: [id number]  HPI 05/01/2015 Chief Complaint  Patient presents with  . Follow-up    Feeling the same,coughing at hs occass.,pnd,white or yellow when "spits up " in am.Denies cp or tightness, no fcs.   Tolerating meds well.  Cough is minimal.  No real dyspnea.  No f/c/s. No chest pain.  No wheezing.  Mucus when productive is clear, notes pndrip.  Pt denies any significant sore throat, nasal congestion or excess secretions, fever, chills, sweats, unintended weight loss, pleurtic or exertional chest pain, orthopnea PND, or leg swelling Pt denies any increase in rescue therapy over baseline, denies waking up needing it or having any early am or nocturnal exacerbations of coughing/wheezing/or dyspnea. Pt also denies any obvious fluctuation in symptoms with  weather or environmental change or other alleviating or aggravating factors   Current Medications, Allergies, Complete Past Medical History, Past Surgical History, Family History, and Social History were reviewed in [facility] record per todays encounter:  05/01/2015  Review of Systems  Constitutional: Negative.   HENT: Negative.  Negative for ear pain, postnasal drip, rhinorrhea, sinus pressure, sore throat, trouble swallowing and voice change.   Eyes: Negative.   Respiratory: Positive for cough. Negative for apnea, choking, chest tightness, shortness of breath, wheezing and stridor.   Cardiovascular: Negative.  Negative for chest pain, palpitations and leg swelling.  Gastrointestinal: Negative.  Negative for nausea, vomiting, abdominal pain and abdominal distention.  Genitourinary: Negative.   Musculoskeletal: Negative.  Negative for myalgias and arthralgias.  Skin: Negative.  Negative for rash.  Allergic/Immunologic: Negative.  Negative for environmental allergies and food allergies.  Neurological: Negative.  Negative for  dizziness, syncope, weakness and headaches.  Hematological: Negative.  Negative for adenopathy. Does not bruise/bleed easily.  Psychiatric/Behavioral: Negative.  Negative for sleep disturbance and agitation. The patient is not nervous/anxious.        Objective:   Physical Exam Filed Vitals:   05/01/15 1426  BP: 132/64  Pulse: 63  Temp: 97.1 F (36.2 C)  TempSrc: Oral  Height: 5' 5.5" (1.664 m)  Weight: 142 lb 12.8 oz (64.774 kg)  SpO2: 96%    Gen: Pleasant, well-nourished, in no distress,  normal affect  ENT: No lesions,  mouth clear,  oropharynx clear, no postnasal drip  Neck: No JVD, no TMG, no carotid bruits  Lungs: No use of accessory muscles, no dullness to percussion, clear without rales or rhonchi  Cardiovascular: RRR, heart sounds normal, no murmur or gallops, no peripheral edema  Abdomen: soft and NT, no HSM,  BS normal  Musculoskeletal: No deformities, no cyanosis or clubbing  Neuro: alert, non focal  Skin: Warm, no lesions or rashes  No results found.        Assessment & Plan:  I personally reviewed all images and lab data in the [facility] system as well as any outside material available during this office visit and agree with the  radiology impressions.   MAI (mycobacterium avium-intracellulare) infection MAI with persistent tree in bud infiltrates and bronchiectasis in Left lingula and RML LLL.  Pos Fob for MAI 08/2013.  Not cultured again recently.  MAC Rx : AZTH/RIF/ETH three times weekly since 11/2014.  Symptoms minimal at present Plan Repeat CT Chest and compare to 10/2014 study Plan STOP three drug Rx end of Sept 2016 No other changes     [name] was seen today for follow-up.  Diagnoses and all orders for this visit:  MAI (mycobacterium avium-intracellulare) infection -     CT Chest Wo Contrast; Future

## 2015-05-16 DIAGNOSIS — E780 Pure hypercholesterolemia: Secondary | ICD-10-CM | POA: Diagnosis not present

## 2015-05-16 DIAGNOSIS — G609 Hereditary and idiopathic neuropathy, unspecified: Secondary | ICD-10-CM | POA: Diagnosis not present

## 2015-05-16 DIAGNOSIS — Z79899 Other long term (current) drug therapy: Secondary | ICD-10-CM | POA: Diagnosis not present

## 2015-05-16 DIAGNOSIS — Z0000 Encounter for general adult medical examination without abnormal findings: Secondary | ICD-10-CM | POA: Diagnosis not present

## 2015-05-16 DIAGNOSIS — E559 Vitamin D deficiency, unspecified: Secondary | ICD-10-CM | POA: Diagnosis not present

## 2015-05-16 DIAGNOSIS — Z23 Encounter for immunization: Secondary | ICD-10-CM | POA: Diagnosis not present

## 2015-05-16 DIAGNOSIS — D510 Vitamin B12 deficiency anemia due to intrinsic factor deficiency: Secondary | ICD-10-CM | POA: Diagnosis not present

## 2015-05-16 DIAGNOSIS — I10 Essential (primary) hypertension: Secondary | ICD-10-CM | POA: Diagnosis not present

## 2015-05-17 ENCOUNTER — Telehealth: Payer: Self-pay | Admitting: Critical Care Medicine

## 2015-05-17 DIAGNOSIS — J479 Bronchiectasis, uncomplicated: Secondary | ICD-10-CM

## 2015-05-17 NOTE — Telephone Encounter (Signed)
ATC PT. Someone answered and did not say anything. Called pt # back and line rings busy wcb

## 2015-05-18 NOTE — Telephone Encounter (Signed)
Spoke with pt, states that her CT scan scheduled at [facility] is being billed wrong and insurance won't cover this.  Pt is requesting that we change the incorrect code for this CT scan.    PCC's please advise on how to proceed with this.  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  Thanks!

## 2015-05-18 NOTE — Telephone Encounter (Signed)
Nichole Gray from [facility] called patient stating that code was incorrect. Patient is scheduled to have CT done on Monday at 1pm.  A31.0 is the code that was put on the order, MAI.  This code is not acceptable for CT.  Spoke with Estill Bamberg in scheduling, she said that code for Bronchiectasis will cover the CT.  She needs a new order faxed to [phone] with this code on it J47.9.    Order re-entered with new diagnosis code.  Dr. Melvyn Novas signed order in Dr. Bettina Gavia absence.  Order was faxed to [facility]. Patient notified. Nothing further needed.

## 2015-05-18 NOTE — Telephone Encounter (Signed)
Patient is upset because she received a call from [facility] saying that the code for the CT scan was entered incorrectly and they are going to charge her for the CT scan unless the code is corrected.  Patient says that she called the [place] office and they told her to call the [facility], now, our office is telling her to contact [place], but she said that Oval Linsey is the one who called her to start with.  She wants to know who entered the incorrect code for the CT and wants it fixed ASAP.  Crystal - do you know anything about this?  Please advise.

## 2015-05-18 NOTE — Telephone Encounter (Signed)
This pt needs to deal with [facility] about the codes we seen them the order and precerted the ct for cpt code [id number] that is all we did Joellen Jersey

## 2015-05-18 NOTE — Telephone Encounter (Signed)
Please call [facility] to figure out what is needed.  I have paper order if needed but order should be in Epic. Please find out if scan was done yet as well.

## 2015-05-21 DIAGNOSIS — A310 Pulmonary mycobacterial infection: Secondary | ICD-10-CM | POA: Diagnosis not present

## 2015-05-21 DIAGNOSIS — R918 Other nonspecific abnormal finding of lung field: Secondary | ICD-10-CM | POA: Diagnosis not present

## 2015-05-21 DIAGNOSIS — J479 Bronchiectasis, uncomplicated: Secondary | ICD-10-CM | POA: Diagnosis not present

## 2015-05-21 DIAGNOSIS — B999 Unspecified infectious disease: Secondary | ICD-10-CM | POA: Diagnosis not present

## 2015-05-28 ENCOUNTER — Telehealth: Payer: Self-pay | Admitting: Pulmonary Disease

## 2015-05-28 NOTE — Telephone Encounter (Signed)
Called and spoke to pt. Pt requesting results of CT chest and OV with VS in [place]. Advised pt VS does not yet have a schedule for [place], recall already in Epic. Pt verbalized understanding. Advised pt I will have CT chest faxed to our office from [facility] for VS to review. Pt stated she is scheduled to complete her MAC meds in 2 weeks.   Called and had CT chest (performed on 9.26.16) faxed to the front fax. Will hold in triage till fax is received for VS to review.

## 2015-05-29 NOTE — Telephone Encounter (Signed)
This fax has not been received. Attempted to call Oval Linsey but did not get an answer. Will try back.

## 2015-05-30 ENCOUNTER — Telehealth: Payer: Self-pay | Admitting: Critical Care Medicine

## 2015-05-30 DIAGNOSIS — A310 Pulmonary mycobacterial infection: Secondary | ICD-10-CM

## 2015-05-30 NOTE — Telephone Encounter (Signed)
Oval Linsey can be pulled in PACS. I printed off report from PACS and placed in VS look at. Please advise thanks

## 2015-05-30 NOTE — Telephone Encounter (Signed)
Let the pt know CT is better.  There is improvement in the lungs on ct.

## 2015-05-31 NOTE — Telephone Encounter (Deleted)
Let the pt know CT is better.  There is improvement in the lungs on ct.

## 2015-05-31 NOTE — Telephone Encounter (Signed)
Spoke with pt regarding below.  Please see phone msg from 05/28/15 for additional information.

## 2015-05-31 NOTE — Telephone Encounter (Signed)
Dr. Joya Gaskins has reviewed pt's recent CT (see phone msg from 05/30/15):  Nichole Stain, MD at 05/30/2015 12:05 PM     Status: Signed       Expand All Collapse All   Let the pt know CT is better. There is improvement in the lungs on ct.        --------  I spoke with pt and discussed above CT Chest results per Dr. Joya Gaskins.  Pt verbalized understanding.    Dr. Halford Chessman, pt was inquiring on scheduling an appt in Nov in [place] to follow up as per instructed during last OV with PW and would like discuss MAI drugs further.  Advised [place] schedule for Nov is not available yet and letter will be sent once schedule is available.  Pt verbalized understanding.  Offered to schedule OV at [place] office to discuss; pt declined d/t transportation.  She would like to ensure it is still appropriate to stop MAI drugs in approx 2 wks when she completes current supply.  States cough is unchanged since last OV with PW and is prod with minimal amount of white to yellow mucus.  Unsure if this coming from her sinuses or lungs. Denies SOB, wheezing, chest tightness, or CP.  Please advise.  Thank you.

## 2015-06-05 NOTE — Telephone Encounter (Signed)
Patient notified of CT results. Dr. Halford Chessman, patient is requesting appointment with you in [place] in Nov.

## 2015-06-05 NOTE — Telephone Encounter (Signed)
I do not have a November schedule for [place] yet.  Still going through credentialing process with [facility].  It would be best if he could come for evaluation in [place] office for next visit, and then future visits can be done in [place].

## 2015-06-05 NOTE — Telephone Encounter (Signed)
Spoke with pt, refused appt in [place] saying that she cannot drive this far.  Pt states she will wait until [place] schedule is laid.  Nothing further needed at this time.

## 2015-06-14 DIAGNOSIS — D510 Vitamin B12 deficiency anemia due to intrinsic factor deficiency: Secondary | ICD-10-CM | POA: Diagnosis not present

## 2015-06-25 ENCOUNTER — Encounter: Payer: Self-pay | Admitting: Critical Care Medicine

## 2015-07-17 DIAGNOSIS — D510 Vitamin B12 deficiency anemia due to intrinsic factor deficiency: Secondary | ICD-10-CM | POA: Diagnosis not present

## 2015-07-30 DIAGNOSIS — Z1231 Encounter for screening mammogram for malignant neoplasm of breast: Secondary | ICD-10-CM | POA: Diagnosis not present

## 2015-07-30 DIAGNOSIS — Z803 Family history of malignant neoplasm of breast: Secondary | ICD-10-CM | POA: Diagnosis not present

## 2015-08-01 DIAGNOSIS — M109 Gout, unspecified: Secondary | ICD-10-CM | POA: Diagnosis not present

## 2015-08-01 DIAGNOSIS — I10 Essential (primary) hypertension: Secondary | ICD-10-CM | POA: Diagnosis not present

## 2015-08-01 DIAGNOSIS — Z6823 Body mass index (BMI) 23.0-23.9, adult: Secondary | ICD-10-CM | POA: Diagnosis not present

## 2015-08-01 DIAGNOSIS — E782 Mixed hyperlipidemia: Secondary | ICD-10-CM | POA: Diagnosis not present

## 2015-08-01 DIAGNOSIS — Z0000 Encounter for general adult medical examination without abnormal findings: Secondary | ICD-10-CM | POA: Diagnosis not present

## 2015-08-16 DIAGNOSIS — D510 Vitamin B12 deficiency anemia due to intrinsic factor deficiency: Secondary | ICD-10-CM | POA: Diagnosis not present

## 2015-08-30 DIAGNOSIS — M109 Gout, unspecified: Secondary | ICD-10-CM | POA: Diagnosis not present

## 2015-08-30 DIAGNOSIS — A310 Pulmonary mycobacterial infection: Secondary | ICD-10-CM | POA: Diagnosis not present

## 2015-08-30 DIAGNOSIS — Z79899 Other long term (current) drug therapy: Secondary | ICD-10-CM | POA: Diagnosis not present

## 2015-08-30 DIAGNOSIS — J45909 Unspecified asthma, uncomplicated: Secondary | ICD-10-CM | POA: Diagnosis not present

## 2015-08-30 DIAGNOSIS — I10 Essential (primary) hypertension: Secondary | ICD-10-CM | POA: Diagnosis not present

## 2015-09-06 DIAGNOSIS — R748 Abnormal levels of other serum enzymes: Secondary | ICD-10-CM | POA: Diagnosis not present

## 2015-09-06 DIAGNOSIS — N289 Disorder of kidney and ureter, unspecified: Secondary | ICD-10-CM | POA: Diagnosis not present

## 2015-09-06 DIAGNOSIS — E7800 Pure hypercholesterolemia, unspecified: Secondary | ICD-10-CM | POA: Diagnosis not present

## 2015-09-11 DIAGNOSIS — R0989 Other specified symptoms and signs involving the circulatory and respiratory systems: Secondary | ICD-10-CM | POA: Diagnosis not present

## 2015-09-11 DIAGNOSIS — D510 Vitamin B12 deficiency anemia due to intrinsic factor deficiency: Secondary | ICD-10-CM | POA: Diagnosis not present

## 2015-09-11 DIAGNOSIS — I6523 Occlusion and stenosis of bilateral carotid arteries: Secondary | ICD-10-CM | POA: Diagnosis not present

## 2015-09-19 DIAGNOSIS — E7800 Pure hypercholesterolemia, unspecified: Secondary | ICD-10-CM | POA: Diagnosis not present

## 2015-09-19 DIAGNOSIS — Z8619 Personal history of other infectious and parasitic diseases: Secondary | ICD-10-CM | POA: Diagnosis not present

## 2015-09-19 DIAGNOSIS — R0602 Shortness of breath: Secondary | ICD-10-CM | POA: Diagnosis not present

## 2015-09-19 DIAGNOSIS — R942 Abnormal results of pulmonary function studies: Secondary | ICD-10-CM | POA: Diagnosis not present

## 2015-10-03 DIAGNOSIS — R942 Abnormal results of pulmonary function studies: Secondary | ICD-10-CM | POA: Diagnosis not present

## 2015-10-03 DIAGNOSIS — A319 Mycobacterial infection, unspecified: Secondary | ICD-10-CM | POA: Diagnosis not present

## 2015-10-03 DIAGNOSIS — R938 Abnormal findings on diagnostic imaging of other specified body structures: Secondary | ICD-10-CM | POA: Diagnosis not present

## 2015-10-03 DIAGNOSIS — M545 Low back pain: Secondary | ICD-10-CM | POA: Diagnosis not present

## 2015-10-03 DIAGNOSIS — I10 Essential (primary) hypertension: Secondary | ICD-10-CM | POA: Diagnosis not present

## 2015-10-15 DIAGNOSIS — D510 Vitamin B12 deficiency anemia due to intrinsic factor deficiency: Secondary | ICD-10-CM | POA: Diagnosis not present

## 2015-11-15 DIAGNOSIS — D510 Vitamin B12 deficiency anemia due to intrinsic factor deficiency: Secondary | ICD-10-CM | POA: Diagnosis not present

## 2015-12-14 DIAGNOSIS — D510 Vitamin B12 deficiency anemia due to intrinsic factor deficiency: Secondary | ICD-10-CM | POA: Diagnosis not present

## 2016-01-14 DIAGNOSIS — D510 Vitamin B12 deficiency anemia due to intrinsic factor deficiency: Secondary | ICD-10-CM | POA: Diagnosis not present

## 2016-01-16 DIAGNOSIS — J189 Pneumonia, unspecified organism: Secondary | ICD-10-CM | POA: Diagnosis not present

## 2016-01-16 DIAGNOSIS — A319 Mycobacterial infection, unspecified: Secondary | ICD-10-CM | POA: Diagnosis not present

## 2016-01-16 DIAGNOSIS — R918 Other nonspecific abnormal finding of lung field: Secondary | ICD-10-CM | POA: Diagnosis not present

## 2016-01-22 DIAGNOSIS — A319 Mycobacterial infection, unspecified: Secondary | ICD-10-CM | POA: Diagnosis not present

## 2016-01-22 DIAGNOSIS — I10 Essential (primary) hypertension: Secondary | ICD-10-CM | POA: Diagnosis not present

## 2016-02-19 DIAGNOSIS — Z79899 Other long term (current) drug therapy: Secondary | ICD-10-CM | POA: Diagnosis not present

## 2016-02-19 DIAGNOSIS — M25512 Pain in left shoulder: Secondary | ICD-10-CM | POA: Diagnosis not present

## 2016-02-19 DIAGNOSIS — D510 Vitamin B12 deficiency anemia due to intrinsic factor deficiency: Secondary | ICD-10-CM | POA: Diagnosis not present

## 2016-02-19 DIAGNOSIS — E785 Hyperlipidemia, unspecified: Secondary | ICD-10-CM | POA: Diagnosis not present

## 2016-02-19 DIAGNOSIS — E559 Vitamin D deficiency, unspecified: Secondary | ICD-10-CM | POA: Diagnosis not present

## 2016-03-07 DIAGNOSIS — D510 Vitamin B12 deficiency anemia due to intrinsic factor deficiency: Secondary | ICD-10-CM | POA: Diagnosis not present

## 2016-03-07 DIAGNOSIS — Z79899 Other long term (current) drug therapy: Secondary | ICD-10-CM | POA: Diagnosis not present

## 2016-04-07 DIAGNOSIS — R0602 Shortness of breath: Secondary | ICD-10-CM | POA: Diagnosis not present

## 2016-04-07 DIAGNOSIS — J209 Acute bronchitis, unspecified: Secondary | ICD-10-CM | POA: Diagnosis not present

## 2016-04-11 DIAGNOSIS — R05 Cough: Secondary | ICD-10-CM | POA: Diagnosis not present

## 2016-04-14 DIAGNOSIS — D510 Vitamin B12 deficiency anemia due to intrinsic factor deficiency: Secondary | ICD-10-CM | POA: Diagnosis not present

## 2016-04-14 DIAGNOSIS — Z79899 Other long term (current) drug therapy: Secondary | ICD-10-CM | POA: Diagnosis not present

## 2016-05-01 DIAGNOSIS — N183 Chronic kidney disease, stage 3 (moderate): Secondary | ICD-10-CM | POA: Diagnosis not present

## 2016-05-01 DIAGNOSIS — E559 Vitamin D deficiency, unspecified: Secondary | ICD-10-CM | POA: Diagnosis not present

## 2016-05-01 DIAGNOSIS — I129 Hypertensive chronic kidney disease with stage 1 through stage 4 chronic kidney disease, or unspecified chronic kidney disease: Secondary | ICD-10-CM | POA: Diagnosis not present

## 2016-05-01 DIAGNOSIS — G629 Polyneuropathy, unspecified: Secondary | ICD-10-CM | POA: Diagnosis not present

## 2016-05-01 DIAGNOSIS — M109 Gout, unspecified: Secondary | ICD-10-CM | POA: Diagnosis not present

## 2016-05-01 DIAGNOSIS — M549 Dorsalgia, unspecified: Secondary | ICD-10-CM | POA: Diagnosis not present

## 2016-05-01 DIAGNOSIS — R351 Nocturia: Secondary | ICD-10-CM | POA: Diagnosis not present

## 2016-05-06 DIAGNOSIS — N185 Chronic kidney disease, stage 5: Secondary | ICD-10-CM | POA: Diagnosis not present

## 2016-05-06 DIAGNOSIS — N183 Chronic kidney disease, stage 3 (moderate): Secondary | ICD-10-CM | POA: Diagnosis not present

## 2016-05-12 DIAGNOSIS — D510 Vitamin B12 deficiency anemia due to intrinsic factor deficiency: Secondary | ICD-10-CM | POA: Diagnosis not present

## 2016-05-12 DIAGNOSIS — R0602 Shortness of breath: Secondary | ICD-10-CM | POA: Diagnosis not present

## 2016-05-12 DIAGNOSIS — M810 Age-related osteoporosis without current pathological fracture: Secondary | ICD-10-CM | POA: Diagnosis not present

## 2016-05-12 DIAGNOSIS — R05 Cough: Secondary | ICD-10-CM | POA: Diagnosis not present

## 2016-05-12 DIAGNOSIS — Z23 Encounter for immunization: Secondary | ICD-10-CM | POA: Diagnosis not present

## 2016-06-16 DIAGNOSIS — D510 Vitamin B12 deficiency anemia due to intrinsic factor deficiency: Secondary | ICD-10-CM | POA: Diagnosis not present

## 2016-07-15 DIAGNOSIS — D510 Vitamin B12 deficiency anemia due to intrinsic factor deficiency: Secondary | ICD-10-CM | POA: Diagnosis not present

## 2016-07-29 DIAGNOSIS — J00 Acute nasopharyngitis [common cold]: Secondary | ICD-10-CM | POA: Diagnosis not present

## 2016-08-07 DIAGNOSIS — I129 Hypertensive chronic kidney disease with stage 1 through stage 4 chronic kidney disease, or unspecified chronic kidney disease: Secondary | ICD-10-CM | POA: Diagnosis not present

## 2016-08-07 DIAGNOSIS — N2581 Secondary hyperparathyroidism of renal origin: Secondary | ICD-10-CM | POA: Diagnosis not present

## 2016-08-07 DIAGNOSIS — N183 Chronic kidney disease, stage 3 (moderate): Secondary | ICD-10-CM | POA: Diagnosis not present

## 2016-08-12 DIAGNOSIS — R0602 Shortness of breath: Secondary | ICD-10-CM | POA: Diagnosis not present

## 2016-08-12 DIAGNOSIS — R918 Other nonspecific abnormal finding of lung field: Secondary | ICD-10-CM | POA: Diagnosis not present

## 2016-08-12 DIAGNOSIS — K219 Gastro-esophageal reflux disease without esophagitis: Secondary | ICD-10-CM | POA: Diagnosis not present

## 2016-08-14 DIAGNOSIS — D510 Vitamin B12 deficiency anemia due to intrinsic factor deficiency: Secondary | ICD-10-CM | POA: Diagnosis not present

## 2016-09-08 DIAGNOSIS — Z1231 Encounter for screening mammogram for malignant neoplasm of breast: Secondary | ICD-10-CM | POA: Diagnosis not present

## 2016-09-15 DIAGNOSIS — D510 Vitamin B12 deficiency anemia due to intrinsic factor deficiency: Secondary | ICD-10-CM | POA: Diagnosis not present

## 2016-10-13 DIAGNOSIS — M109 Gout, unspecified: Secondary | ICD-10-CM | POA: Diagnosis not present

## 2016-10-13 DIAGNOSIS — D510 Vitamin B12 deficiency anemia due to intrinsic factor deficiency: Secondary | ICD-10-CM | POA: Diagnosis not present

## 2016-10-13 DIAGNOSIS — R942 Abnormal results of pulmonary function studies: Secondary | ICD-10-CM | POA: Diagnosis not present

## 2016-10-13 DIAGNOSIS — E785 Hyperlipidemia, unspecified: Secondary | ICD-10-CM | POA: Diagnosis not present

## 2016-10-13 DIAGNOSIS — J209 Acute bronchitis, unspecified: Secondary | ICD-10-CM | POA: Diagnosis not present

## 2016-10-13 DIAGNOSIS — Z79899 Other long term (current) drug therapy: Secondary | ICD-10-CM | POA: Diagnosis not present

## 2016-10-21 DIAGNOSIS — H903 Sensorineural hearing loss, bilateral: Secondary | ICD-10-CM | POA: Diagnosis not present

## 2016-11-11 DIAGNOSIS — D510 Vitamin B12 deficiency anemia due to intrinsic factor deficiency: Secondary | ICD-10-CM | POA: Diagnosis not present

## 2016-11-11 DIAGNOSIS — R7989 Other specified abnormal findings of blood chemistry: Secondary | ICD-10-CM | POA: Diagnosis not present

## 2016-12-15 DIAGNOSIS — R7989 Other specified abnormal findings of blood chemistry: Secondary | ICD-10-CM | POA: Diagnosis not present

## 2016-12-15 DIAGNOSIS — D510 Vitamin B12 deficiency anemia due to intrinsic factor deficiency: Secondary | ICD-10-CM | POA: Diagnosis not present

## 2016-12-15 DIAGNOSIS — E785 Hyperlipidemia, unspecified: Secondary | ICD-10-CM | POA: Diagnosis not present

## 2016-12-22 DIAGNOSIS — Z9181 History of falling: Secondary | ICD-10-CM | POA: Diagnosis not present

## 2016-12-22 DIAGNOSIS — Z1389 Encounter for screening for other disorder: Secondary | ICD-10-CM | POA: Diagnosis not present

## 2016-12-22 DIAGNOSIS — Z0000 Encounter for general adult medical examination without abnormal findings: Secondary | ICD-10-CM | POA: Diagnosis not present

## 2016-12-22 DIAGNOSIS — J209 Acute bronchitis, unspecified: Secondary | ICD-10-CM | POA: Diagnosis not present

## 2016-12-22 DIAGNOSIS — Z6823 Body mass index (BMI) 23.0-23.9, adult: Secondary | ICD-10-CM | POA: Diagnosis not present

## 2017-01-07 DIAGNOSIS — E875 Hyperkalemia: Secondary | ICD-10-CM | POA: Diagnosis not present

## 2017-01-07 DIAGNOSIS — I129 Hypertensive chronic kidney disease with stage 1 through stage 4 chronic kidney disease, or unspecified chronic kidney disease: Secondary | ICD-10-CM | POA: Diagnosis not present

## 2017-01-07 DIAGNOSIS — N2581 Secondary hyperparathyroidism of renal origin: Secondary | ICD-10-CM | POA: Diagnosis not present

## 2017-01-07 DIAGNOSIS — E559 Vitamin D deficiency, unspecified: Secondary | ICD-10-CM | POA: Diagnosis not present

## 2017-01-07 DIAGNOSIS — N183 Chronic kidney disease, stage 3 (moderate): Secondary | ICD-10-CM | POA: Diagnosis not present

## 2017-01-19 DIAGNOSIS — R942 Abnormal results of pulmonary function studies: Secondary | ICD-10-CM | POA: Diagnosis not present

## 2017-01-19 DIAGNOSIS — R0602 Shortness of breath: Secondary | ICD-10-CM | POA: Diagnosis not present

## 2017-01-22 DIAGNOSIS — D510 Vitamin B12 deficiency anemia due to intrinsic factor deficiency: Secondary | ICD-10-CM | POA: Diagnosis not present

## 2017-02-18 DIAGNOSIS — D510 Vitamin B12 deficiency anemia due to intrinsic factor deficiency: Secondary | ICD-10-CM | POA: Diagnosis not present

## 2017-02-26 DIAGNOSIS — Z6823 Body mass index (BMI) 23.0-23.9, adult: Secondary | ICD-10-CM | POA: Diagnosis not present

## 2017-02-26 DIAGNOSIS — I10 Essential (primary) hypertension: Secondary | ICD-10-CM | POA: Diagnosis not present

## 2017-02-26 DIAGNOSIS — G629 Polyneuropathy, unspecified: Secondary | ICD-10-CM | POA: Diagnosis not present

## 2017-02-26 DIAGNOSIS — N289 Disorder of kidney and ureter, unspecified: Secondary | ICD-10-CM | POA: Diagnosis not present

## 2017-02-26 DIAGNOSIS — E2839 Other primary ovarian failure: Secondary | ICD-10-CM | POA: Diagnosis not present

## 2017-03-12 DIAGNOSIS — D510 Vitamin B12 deficiency anemia due to intrinsic factor deficiency: Secondary | ICD-10-CM | POA: Diagnosis not present

## 2017-04-13 DIAGNOSIS — I10 Essential (primary) hypertension: Secondary | ICD-10-CM | POA: Diagnosis not present

## 2017-04-13 DIAGNOSIS — D510 Vitamin B12 deficiency anemia due to intrinsic factor deficiency: Secondary | ICD-10-CM | POA: Diagnosis not present

## 2017-04-15 DIAGNOSIS — I10 Essential (primary) hypertension: Secondary | ICD-10-CM | POA: Diagnosis not present

## 2017-04-15 DIAGNOSIS — Z6823 Body mass index (BMI) 23.0-23.9, adult: Secondary | ICD-10-CM | POA: Diagnosis not present

## 2017-04-15 DIAGNOSIS — J209 Acute bronchitis, unspecified: Secondary | ICD-10-CM | POA: Diagnosis not present

## 2017-04-21 DIAGNOSIS — J45901 Unspecified asthma with (acute) exacerbation: Secondary | ICD-10-CM | POA: Diagnosis not present

## 2017-04-21 DIAGNOSIS — E2839 Other primary ovarian failure: Secondary | ICD-10-CM | POA: Diagnosis not present

## 2017-04-21 DIAGNOSIS — K219 Gastro-esophageal reflux disease without esophagitis: Secondary | ICD-10-CM | POA: Diagnosis not present

## 2017-04-21 DIAGNOSIS — Z6823 Body mass index (BMI) 23.0-23.9, adult: Secondary | ICD-10-CM | POA: Diagnosis not present

## 2017-05-13 DIAGNOSIS — E559 Vitamin D deficiency, unspecified: Secondary | ICD-10-CM | POA: Diagnosis not present

## 2017-05-13 DIAGNOSIS — N183 Chronic kidney disease, stage 3 (moderate): Secondary | ICD-10-CM | POA: Diagnosis not present

## 2017-05-13 DIAGNOSIS — I129 Hypertensive chronic kidney disease with stage 1 through stage 4 chronic kidney disease, or unspecified chronic kidney disease: Secondary | ICD-10-CM | POA: Diagnosis not present

## 2017-05-13 DIAGNOSIS — N2581 Secondary hyperparathyroidism of renal origin: Secondary | ICD-10-CM | POA: Diagnosis not present

## 2017-05-22 DIAGNOSIS — Z23 Encounter for immunization: Secondary | ICD-10-CM | POA: Diagnosis not present

## 2017-05-22 DIAGNOSIS — I10 Essential (primary) hypertension: Secondary | ICD-10-CM | POA: Diagnosis not present

## 2017-05-22 DIAGNOSIS — R05 Cough: Secondary | ICD-10-CM | POA: Diagnosis not present

## 2017-05-22 DIAGNOSIS — R0602 Shortness of breath: Secondary | ICD-10-CM | POA: Diagnosis not present

## 2017-07-14 DIAGNOSIS — D510 Vitamin B12 deficiency anemia due to intrinsic factor deficiency: Secondary | ICD-10-CM | POA: Diagnosis not present

## 2017-08-12 DIAGNOSIS — D510 Vitamin B12 deficiency anemia due to intrinsic factor deficiency: Secondary | ICD-10-CM | POA: Diagnosis not present

## 2017-09-15 DIAGNOSIS — D510 Vitamin B12 deficiency anemia due to intrinsic factor deficiency: Secondary | ICD-10-CM | POA: Diagnosis not present

## 2017-09-16 DIAGNOSIS — I129 Hypertensive chronic kidney disease with stage 1 through stage 4 chronic kidney disease, or unspecified chronic kidney disease: Secondary | ICD-10-CM | POA: Diagnosis not present

## 2017-09-16 DIAGNOSIS — N183 Chronic kidney disease, stage 3 (moderate): Secondary | ICD-10-CM | POA: Diagnosis not present

## 2017-09-28 DIAGNOSIS — R0602 Shortness of breath: Secondary | ICD-10-CM | POA: Diagnosis not present

## 2017-09-28 DIAGNOSIS — R942 Abnormal results of pulmonary function studies: Secondary | ICD-10-CM | POA: Diagnosis not present

## 2017-09-28 DIAGNOSIS — I10 Essential (primary) hypertension: Secondary | ICD-10-CM | POA: Diagnosis not present

## 2017-10-19 DIAGNOSIS — I10 Essential (primary) hypertension: Secondary | ICD-10-CM | POA: Diagnosis not present

## 2017-10-19 DIAGNOSIS — M109 Gout, unspecified: Secondary | ICD-10-CM | POA: Diagnosis not present

## 2017-10-19 DIAGNOSIS — J209 Acute bronchitis, unspecified: Secondary | ICD-10-CM | POA: Diagnosis not present

## 2017-10-19 DIAGNOSIS — E559 Vitamin D deficiency, unspecified: Secondary | ICD-10-CM | POA: Diagnosis not present

## 2017-10-19 DIAGNOSIS — Z6824 Body mass index (BMI) 24.0-24.9, adult: Secondary | ICD-10-CM | POA: Diagnosis not present

## 2017-10-19 DIAGNOSIS — D510 Vitamin B12 deficiency anemia due to intrinsic factor deficiency: Secondary | ICD-10-CM | POA: Diagnosis not present

## 2017-11-03 DIAGNOSIS — I10 Essential (primary) hypertension: Secondary | ICD-10-CM | POA: Diagnosis not present

## 2017-11-03 DIAGNOSIS — R6883 Chills (without fever): Secondary | ICD-10-CM | POA: Diagnosis not present

## 2017-11-03 DIAGNOSIS — Z6824 Body mass index (BMI) 24.0-24.9, adult: Secondary | ICD-10-CM | POA: Diagnosis not present

## 2017-11-10 DIAGNOSIS — J209 Acute bronchitis, unspecified: Secondary | ICD-10-CM | POA: Diagnosis not present

## 2017-11-17 DIAGNOSIS — J208 Acute bronchitis due to other specified organisms: Secondary | ICD-10-CM | POA: Diagnosis not present

## 2017-11-18 DIAGNOSIS — D510 Vitamin B12 deficiency anemia due to intrinsic factor deficiency: Secondary | ICD-10-CM | POA: Diagnosis not present

## 2017-12-03 DIAGNOSIS — R51 Headache: Secondary | ICD-10-CM | POA: Diagnosis not present

## 2017-12-03 DIAGNOSIS — Z6824 Body mass index (BMI) 24.0-24.9, adult: Secondary | ICD-10-CM | POA: Diagnosis not present

## 2017-12-03 DIAGNOSIS — I10 Essential (primary) hypertension: Secondary | ICD-10-CM | POA: Diagnosis not present

## 2017-12-08 DIAGNOSIS — D510 Vitamin B12 deficiency anemia due to intrinsic factor deficiency: Secondary | ICD-10-CM | POA: Diagnosis not present

## 2018-01-01 DIAGNOSIS — R0602 Shortness of breath: Secondary | ICD-10-CM | POA: Diagnosis not present

## 2018-01-01 DIAGNOSIS — J208 Acute bronchitis due to other specified organisms: Secondary | ICD-10-CM | POA: Diagnosis not present

## 2018-01-08 DIAGNOSIS — R9389 Abnormal findings on diagnostic imaging of other specified body structures: Secondary | ICD-10-CM | POA: Diagnosis not present

## 2018-01-08 DIAGNOSIS — J188 Other pneumonia, unspecified organism: Secondary | ICD-10-CM | POA: Diagnosis not present

## 2018-01-08 DIAGNOSIS — J208 Acute bronchitis due to other specified organisms: Secondary | ICD-10-CM | POA: Diagnosis not present

## 2018-01-14 DIAGNOSIS — D510 Vitamin B12 deficiency anemia due to intrinsic factor deficiency: Secondary | ICD-10-CM | POA: Diagnosis not present

## 2018-01-14 DIAGNOSIS — N183 Chronic kidney disease, stage 3 (moderate): Secondary | ICD-10-CM | POA: Diagnosis not present

## 2018-01-14 DIAGNOSIS — I129 Hypertensive chronic kidney disease with stage 1 through stage 4 chronic kidney disease, or unspecified chronic kidney disease: Secondary | ICD-10-CM | POA: Diagnosis not present

## 2018-01-22 DIAGNOSIS — J208 Acute bronchitis due to other specified organisms: Secondary | ICD-10-CM | POA: Diagnosis not present

## 2018-01-22 DIAGNOSIS — I10 Essential (primary) hypertension: Secondary | ICD-10-CM | POA: Diagnosis not present

## 2018-02-12 DIAGNOSIS — D510 Vitamin B12 deficiency anemia due to intrinsic factor deficiency: Secondary | ICD-10-CM | POA: Diagnosis not present

## 2018-03-18 DIAGNOSIS — D510 Vitamin B12 deficiency anemia due to intrinsic factor deficiency: Secondary | ICD-10-CM | POA: Diagnosis not present

## 2018-03-30 DIAGNOSIS — J208 Acute bronchitis due to other specified organisms: Secondary | ICD-10-CM | POA: Diagnosis not present

## 2018-03-30 DIAGNOSIS — R0602 Shortness of breath: Secondary | ICD-10-CM | POA: Diagnosis not present

## 2018-03-30 DIAGNOSIS — Z6823 Body mass index (BMI) 23.0-23.9, adult: Secondary | ICD-10-CM | POA: Diagnosis not present

## 2018-04-13 DIAGNOSIS — Z79899 Other long term (current) drug therapy: Secondary | ICD-10-CM | POA: Diagnosis not present

## 2018-04-13 DIAGNOSIS — Z1339 Encounter for screening examination for other mental health and behavioral disorders: Secondary | ICD-10-CM | POA: Diagnosis not present

## 2018-04-13 DIAGNOSIS — Z0000 Encounter for general adult medical examination without abnormal findings: Secondary | ICD-10-CM | POA: Diagnosis not present

## 2018-04-13 DIAGNOSIS — Z1331 Encounter for screening for depression: Secondary | ICD-10-CM | POA: Diagnosis not present

## 2018-04-13 DIAGNOSIS — D510 Vitamin B12 deficiency anemia due to intrinsic factor deficiency: Secondary | ICD-10-CM | POA: Diagnosis not present

## 2018-04-13 DIAGNOSIS — Z9181 History of falling: Secondary | ICD-10-CM | POA: Diagnosis not present

## 2018-04-14 DIAGNOSIS — M542 Cervicalgia: Secondary | ICD-10-CM | POA: Diagnosis not present

## 2018-04-22 DIAGNOSIS — G44221 Chronic tension-type headache, intractable: Secondary | ICD-10-CM | POA: Diagnosis not present

## 2018-04-22 DIAGNOSIS — M47812 Spondylosis without myelopathy or radiculopathy, cervical region: Secondary | ICD-10-CM | POA: Diagnosis not present

## 2018-04-22 DIAGNOSIS — G609 Hereditary and idiopathic neuropathy, unspecified: Secondary | ICD-10-CM | POA: Diagnosis not present

## 2018-05-03 DIAGNOSIS — R0600 Dyspnea, unspecified: Secondary | ICD-10-CM | POA: Diagnosis not present

## 2018-05-03 DIAGNOSIS — J069 Acute upper respiratory infection, unspecified: Secondary | ICD-10-CM | POA: Diagnosis not present

## 2018-05-18 DIAGNOSIS — I129 Hypertensive chronic kidney disease with stage 1 through stage 4 chronic kidney disease, or unspecified chronic kidney disease: Secondary | ICD-10-CM | POA: Diagnosis not present

## 2018-05-18 DIAGNOSIS — N183 Chronic kidney disease, stage 3 (moderate): Secondary | ICD-10-CM | POA: Diagnosis not present

## 2018-05-20 DIAGNOSIS — D510 Vitamin B12 deficiency anemia due to intrinsic factor deficiency: Secondary | ICD-10-CM | POA: Diagnosis not present

## 2018-06-03 DIAGNOSIS — G609 Hereditary and idiopathic neuropathy, unspecified: Secondary | ICD-10-CM | POA: Diagnosis not present

## 2018-06-03 DIAGNOSIS — M47812 Spondylosis without myelopathy or radiculopathy, cervical region: Secondary | ICD-10-CM | POA: Diagnosis not present

## 2018-06-03 DIAGNOSIS — G44221 Chronic tension-type headache, intractable: Secondary | ICD-10-CM | POA: Diagnosis not present

## 2018-06-16 DIAGNOSIS — J208 Acute bronchitis due to other specified organisms: Secondary | ICD-10-CM | POA: Diagnosis not present

## 2018-06-16 DIAGNOSIS — R0602 Shortness of breath: Secondary | ICD-10-CM | POA: Diagnosis not present

## 2018-06-16 DIAGNOSIS — Z6822 Body mass index (BMI) 22.0-22.9, adult: Secondary | ICD-10-CM | POA: Diagnosis not present

## 2018-06-23 DIAGNOSIS — Z6822 Body mass index (BMI) 22.0-22.9, adult: Secondary | ICD-10-CM | POA: Diagnosis not present

## 2018-06-23 DIAGNOSIS — J208 Acute bronchitis due to other specified organisms: Secondary | ICD-10-CM | POA: Diagnosis not present

## 2018-07-13 DIAGNOSIS — Z23 Encounter for immunization: Secondary | ICD-10-CM | POA: Diagnosis not present

## 2018-07-13 DIAGNOSIS — D510 Vitamin B12 deficiency anemia due to intrinsic factor deficiency: Secondary | ICD-10-CM | POA: Diagnosis not present

## 2018-08-12 DIAGNOSIS — D510 Vitamin B12 deficiency anemia due to intrinsic factor deficiency: Secondary | ICD-10-CM | POA: Diagnosis not present

## 2018-08-16 DIAGNOSIS — J208 Acute bronchitis due to other specified organisms: Secondary | ICD-10-CM | POA: Diagnosis not present

## 2018-08-24 DIAGNOSIS — J208 Acute bronchitis due to other specified organisms: Secondary | ICD-10-CM | POA: Diagnosis not present

## 2018-08-24 DIAGNOSIS — Z6823 Body mass index (BMI) 23.0-23.9, adult: Secondary | ICD-10-CM | POA: Diagnosis not present

## 2018-09-14 DIAGNOSIS — E785 Hyperlipidemia, unspecified: Secondary | ICD-10-CM | POA: Diagnosis not present

## 2018-09-14 DIAGNOSIS — M109 Gout, unspecified: Secondary | ICD-10-CM | POA: Diagnosis not present

## 2018-09-14 DIAGNOSIS — I10 Essential (primary) hypertension: Secondary | ICD-10-CM | POA: Diagnosis not present

## 2018-09-14 DIAGNOSIS — L821 Other seborrheic keratosis: Secondary | ICD-10-CM | POA: Diagnosis not present

## 2018-09-14 DIAGNOSIS — G609 Hereditary and idiopathic neuropathy, unspecified: Secondary | ICD-10-CM | POA: Diagnosis not present

## 2018-09-14 DIAGNOSIS — E559 Vitamin D deficiency, unspecified: Secondary | ICD-10-CM | POA: Diagnosis not present

## 2018-09-14 DIAGNOSIS — Z79899 Other long term (current) drug therapy: Secondary | ICD-10-CM | POA: Diagnosis not present

## 2018-09-14 DIAGNOSIS — Z6823 Body mass index (BMI) 23.0-23.9, adult: Secondary | ICD-10-CM | POA: Diagnosis not present

## 2018-09-14 DIAGNOSIS — D510 Vitamin B12 deficiency anemia due to intrinsic factor deficiency: Secondary | ICD-10-CM | POA: Diagnosis not present

## 2018-09-21 DIAGNOSIS — N183 Chronic kidney disease, stage 3 (moderate): Secondary | ICD-10-CM | POA: Diagnosis not present

## 2018-09-21 DIAGNOSIS — I129 Hypertensive chronic kidney disease with stage 1 through stage 4 chronic kidney disease, or unspecified chronic kidney disease: Secondary | ICD-10-CM | POA: Diagnosis not present

## 2018-09-29 DIAGNOSIS — R942 Abnormal results of pulmonary function studies: Secondary | ICD-10-CM | POA: Diagnosis not present

## 2018-09-29 DIAGNOSIS — J209 Acute bronchitis, unspecified: Secondary | ICD-10-CM | POA: Diagnosis not present

## 2018-09-29 DIAGNOSIS — Z6823 Body mass index (BMI) 23.0-23.9, adult: Secondary | ICD-10-CM | POA: Diagnosis not present

## 2018-09-29 DIAGNOSIS — I10 Essential (primary) hypertension: Secondary | ICD-10-CM | POA: Diagnosis not present

## 2018-09-29 DIAGNOSIS — R0602 Shortness of breath: Secondary | ICD-10-CM | POA: Diagnosis not present

## 2018-10-04 DIAGNOSIS — G44221 Chronic tension-type headache, intractable: Secondary | ICD-10-CM | POA: Diagnosis not present

## 2018-10-04 DIAGNOSIS — G609 Hereditary and idiopathic neuropathy, unspecified: Secondary | ICD-10-CM | POA: Diagnosis not present

## 2018-10-04 DIAGNOSIS — M47812 Spondylosis without myelopathy or radiculopathy, cervical region: Secondary | ICD-10-CM | POA: Diagnosis not present

## 2018-10-07 DIAGNOSIS — M542 Cervicalgia: Secondary | ICD-10-CM | POA: Diagnosis not present

## 2018-10-07 DIAGNOSIS — M47812 Spondylosis without myelopathy or radiculopathy, cervical region: Secondary | ICD-10-CM | POA: Diagnosis not present

## 2018-10-11 DIAGNOSIS — M542 Cervicalgia: Secondary | ICD-10-CM | POA: Diagnosis not present

## 2018-10-11 DIAGNOSIS — M47812 Spondylosis without myelopathy or radiculopathy, cervical region: Secondary | ICD-10-CM | POA: Diagnosis not present

## 2018-10-12 DIAGNOSIS — D510 Vitamin B12 deficiency anemia due to intrinsic factor deficiency: Secondary | ICD-10-CM | POA: Diagnosis not present

## 2018-10-12 DIAGNOSIS — Z1231 Encounter for screening mammogram for malignant neoplasm of breast: Secondary | ICD-10-CM | POA: Diagnosis not present

## 2018-10-14 DIAGNOSIS — M47812 Spondylosis without myelopathy or radiculopathy, cervical region: Secondary | ICD-10-CM | POA: Diagnosis not present

## 2018-10-14 DIAGNOSIS — M542 Cervicalgia: Secondary | ICD-10-CM | POA: Diagnosis not present

## 2018-10-19 DIAGNOSIS — M47812 Spondylosis without myelopathy or radiculopathy, cervical region: Secondary | ICD-10-CM | POA: Diagnosis not present

## 2018-10-19 DIAGNOSIS — M542 Cervicalgia: Secondary | ICD-10-CM | POA: Diagnosis not present

## 2018-10-20 ENCOUNTER — Other Ambulatory Visit: Payer: Self-pay | Admitting: Family Medicine

## 2018-10-20 DIAGNOSIS — R928 Other abnormal and inconclusive findings on diagnostic imaging of breast: Secondary | ICD-10-CM | POA: Diagnosis not present

## 2018-10-20 DIAGNOSIS — N6489 Other specified disorders of breast: Secondary | ICD-10-CM

## 2018-10-20 DIAGNOSIS — R922 Inconclusive mammogram: Secondary | ICD-10-CM | POA: Diagnosis not present

## 2018-10-21 DIAGNOSIS — M542 Cervicalgia: Secondary | ICD-10-CM | POA: Diagnosis not present

## 2018-10-21 DIAGNOSIS — M47812 Spondylosis without myelopathy or radiculopathy, cervical region: Secondary | ICD-10-CM | POA: Diagnosis not present

## 2018-10-22 ENCOUNTER — Ambulatory Visit
Admission: RE | Admit: 2018-10-22 | Discharge: 2018-10-22 | Disposition: A | Discharge status: Home / Self Care | Payer: Medicare HMO | Source: Ambulatory Visit | Attending: Family Medicine | Admitting: Family Medicine

## 2018-10-22 DIAGNOSIS — N6012 Diffuse cystic mastopathy of left breast: Secondary | ICD-10-CM | POA: Diagnosis not present

## 2018-10-22 DIAGNOSIS — N6489 Other specified disorders of breast: Secondary | ICD-10-CM

## 2018-10-22 DIAGNOSIS — R928 Other abnormal and inconclusive findings on diagnostic imaging of breast: Secondary | ICD-10-CM | POA: Diagnosis not present

## 2018-10-27 DIAGNOSIS — C50512 Malignant neoplasm of lower-outer quadrant of left female breast: Secondary | ICD-10-CM | POA: Diagnosis not present

## 2018-10-27 DIAGNOSIS — Z170 Estrogen receptor positive status [ER+]: Secondary | ICD-10-CM | POA: Diagnosis not present

## 2018-10-28 DIAGNOSIS — M47812 Spondylosis without myelopathy or radiculopathy, cervical region: Secondary | ICD-10-CM | POA: Diagnosis not present

## 2018-10-28 DIAGNOSIS — M542 Cervicalgia: Secondary | ICD-10-CM | POA: Diagnosis not present

## 2018-11-01 DIAGNOSIS — C50919 Malignant neoplasm of unspecified site of unspecified female breast: Secondary | ICD-10-CM | POA: Insufficient documentation

## 2018-11-01 DIAGNOSIS — C50112 Malignant neoplasm of central portion of left female breast: Secondary | ICD-10-CM | POA: Insufficient documentation

## 2018-11-02 DIAGNOSIS — M542 Cervicalgia: Secondary | ICD-10-CM | POA: Diagnosis not present

## 2018-11-02 DIAGNOSIS — M47812 Spondylosis without myelopathy or radiculopathy, cervical region: Secondary | ICD-10-CM | POA: Diagnosis not present

## 2018-11-04 DIAGNOSIS — M47812 Spondylosis without myelopathy or radiculopathy, cervical region: Secondary | ICD-10-CM | POA: Diagnosis not present

## 2018-11-04 DIAGNOSIS — M542 Cervicalgia: Secondary | ICD-10-CM | POA: Diagnosis not present

## 2018-11-09 DIAGNOSIS — M542 Cervicalgia: Secondary | ICD-10-CM | POA: Diagnosis not present

## 2018-11-09 DIAGNOSIS — M47812 Spondylosis without myelopathy or radiculopathy, cervical region: Secondary | ICD-10-CM | POA: Diagnosis not present

## 2018-11-16 DIAGNOSIS — G629 Polyneuropathy, unspecified: Secondary | ICD-10-CM | POA: Diagnosis not present

## 2018-11-16 DIAGNOSIS — F0390 Unspecified dementia without behavioral disturbance: Secondary | ICD-10-CM | POA: Diagnosis not present

## 2018-11-16 DIAGNOSIS — C50912 Malignant neoplasm of unspecified site of left female breast: Secondary | ICD-10-CM | POA: Diagnosis not present

## 2018-11-16 DIAGNOSIS — J439 Emphysema, unspecified: Secondary | ICD-10-CM | POA: Diagnosis not present

## 2018-11-16 DIAGNOSIS — Z87891 Personal history of nicotine dependence: Secondary | ICD-10-CM | POA: Diagnosis not present

## 2018-11-16 DIAGNOSIS — I10 Essential (primary) hypertension: Secondary | ICD-10-CM | POA: Diagnosis not present

## 2018-11-16 DIAGNOSIS — N183 Chronic kidney disease, stage 3 (moderate): Secondary | ICD-10-CM | POA: Diagnosis not present

## 2018-11-16 DIAGNOSIS — J449 Chronic obstructive pulmonary disease, unspecified: Secondary | ICD-10-CM | POA: Diagnosis not present

## 2018-11-16 DIAGNOSIS — C50512 Malignant neoplasm of lower-outer quadrant of left female breast: Secondary | ICD-10-CM | POA: Diagnosis not present

## 2018-11-16 DIAGNOSIS — J45909 Unspecified asthma, uncomplicated: Secondary | ICD-10-CM | POA: Diagnosis not present

## 2018-11-16 DIAGNOSIS — I129 Hypertensive chronic kidney disease with stage 1 through stage 4 chronic kidney disease, or unspecified chronic kidney disease: Secondary | ICD-10-CM | POA: Diagnosis not present

## 2018-11-16 DIAGNOSIS — Z01818 Encounter for other preprocedural examination: Secondary | ICD-10-CM | POA: Diagnosis not present

## 2018-11-25 DIAGNOSIS — Z170 Estrogen receptor positive status [ER+]: Secondary | ICD-10-CM | POA: Diagnosis not present

## 2018-11-25 DIAGNOSIS — C50412 Malignant neoplasm of upper-outer quadrant of left female breast: Secondary | ICD-10-CM | POA: Diagnosis not present

## 2018-12-31 DIAGNOSIS — Z170 Estrogen receptor positive status [ER+]: Secondary | ICD-10-CM | POA: Diagnosis not present

## 2018-12-31 DIAGNOSIS — C50112 Malignant neoplasm of central portion of left female breast: Secondary | ICD-10-CM | POA: Diagnosis not present

## 2019-01-10 DIAGNOSIS — M8589 Other specified disorders of bone density and structure, multiple sites: Secondary | ICD-10-CM | POA: Diagnosis not present

## 2019-01-10 DIAGNOSIS — D519 Vitamin B12 deficiency anemia, unspecified: Secondary | ICD-10-CM | POA: Diagnosis not present

## 2019-01-10 DIAGNOSIS — M85851 Other specified disorders of bone density and structure, right thigh: Secondary | ICD-10-CM | POA: Diagnosis not present

## 2019-01-14 DIAGNOSIS — R05 Cough: Secondary | ICD-10-CM | POA: Diagnosis not present

## 2019-01-14 DIAGNOSIS — J208 Acute bronchitis due to other specified organisms: Secondary | ICD-10-CM | POA: Diagnosis not present

## 2019-01-14 DIAGNOSIS — R0602 Shortness of breath: Secondary | ICD-10-CM | POA: Diagnosis not present

## 2019-02-10 DIAGNOSIS — D519 Vitamin B12 deficiency anemia, unspecified: Secondary | ICD-10-CM | POA: Diagnosis not present

## 2019-03-08 DIAGNOSIS — N183 Chronic kidney disease, stage 3 (moderate): Secondary | ICD-10-CM | POA: Diagnosis not present

## 2019-03-08 DIAGNOSIS — I129 Hypertensive chronic kidney disease with stage 1 through stage 4 chronic kidney disease, or unspecified chronic kidney disease: Secondary | ICD-10-CM | POA: Diagnosis not present

## 2019-03-08 DIAGNOSIS — N2581 Secondary hyperparathyroidism of renal origin: Secondary | ICD-10-CM | POA: Diagnosis not present

## 2019-03-14 DIAGNOSIS — E538 Deficiency of other specified B group vitamins: Secondary | ICD-10-CM | POA: Diagnosis not present

## 2019-03-31 DIAGNOSIS — J208 Acute bronchitis due to other specified organisms: Secondary | ICD-10-CM | POA: Diagnosis not present

## 2019-03-31 DIAGNOSIS — Z6823 Body mass index (BMI) 23.0-23.9, adult: Secondary | ICD-10-CM | POA: Diagnosis not present

## 2019-03-31 DIAGNOSIS — I10 Essential (primary) hypertension: Secondary | ICD-10-CM | POA: Diagnosis not present

## 2019-03-31 DIAGNOSIS — J209 Acute bronchitis, unspecified: Secondary | ICD-10-CM | POA: Diagnosis not present

## 2019-03-31 DIAGNOSIS — R0602 Shortness of breath: Secondary | ICD-10-CM | POA: Diagnosis not present

## 2019-04-07 DIAGNOSIS — J449 Chronic obstructive pulmonary disease, unspecified: Secondary | ICD-10-CM | POA: Diagnosis not present

## 2019-04-07 DIAGNOSIS — J208 Acute bronchitis due to other specified organisms: Secondary | ICD-10-CM | POA: Diagnosis not present

## 2019-04-15 DIAGNOSIS — I10 Essential (primary) hypertension: Secondary | ICD-10-CM | POA: Diagnosis not present

## 2019-04-15 DIAGNOSIS — M109 Gout, unspecified: Secondary | ICD-10-CM | POA: Diagnosis not present

## 2019-04-15 DIAGNOSIS — Z6824 Body mass index (BMI) 24.0-24.9, adult: Secondary | ICD-10-CM | POA: Diagnosis not present

## 2019-04-15 DIAGNOSIS — Z1331 Encounter for screening for depression: Secondary | ICD-10-CM | POA: Diagnosis not present

## 2019-04-15 DIAGNOSIS — Z0000 Encounter for general adult medical examination without abnormal findings: Secondary | ICD-10-CM | POA: Diagnosis not present

## 2019-04-15 DIAGNOSIS — Z9181 History of falling: Secondary | ICD-10-CM | POA: Diagnosis not present

## 2019-04-22 DIAGNOSIS — D519 Vitamin B12 deficiency anemia, unspecified: Secondary | ICD-10-CM | POA: Diagnosis not present

## 2019-05-05 DIAGNOSIS — C50112 Malignant neoplasm of central portion of left female breast: Secondary | ICD-10-CM | POA: Diagnosis not present

## 2019-05-18 DIAGNOSIS — D519 Vitamin B12 deficiency anemia, unspecified: Secondary | ICD-10-CM | POA: Diagnosis not present

## 2019-06-15 DIAGNOSIS — E538 Deficiency of other specified B group vitamins: Secondary | ICD-10-CM | POA: Diagnosis not present

## 2019-06-20 DIAGNOSIS — J441 Chronic obstructive pulmonary disease with (acute) exacerbation: Secondary | ICD-10-CM | POA: Diagnosis not present

## 2019-06-20 DIAGNOSIS — Z6823 Body mass index (BMI) 23.0-23.9, adult: Secondary | ICD-10-CM | POA: Diagnosis not present

## 2019-06-20 DIAGNOSIS — Z1331 Encounter for screening for depression: Secondary | ICD-10-CM | POA: Diagnosis not present

## 2019-06-20 DIAGNOSIS — Z23 Encounter for immunization: Secondary | ICD-10-CM | POA: Diagnosis not present

## 2019-06-20 DIAGNOSIS — R1032 Left lower quadrant pain: Secondary | ICD-10-CM | POA: Diagnosis not present

## 2019-07-18 DIAGNOSIS — D519 Vitamin B12 deficiency anemia, unspecified: Secondary | ICD-10-CM | POA: Diagnosis not present

## 2019-08-02 DIAGNOSIS — Z79811 Long term (current) use of aromatase inhibitors: Secondary | ICD-10-CM | POA: Diagnosis not present

## 2019-08-02 DIAGNOSIS — Z853 Personal history of malignant neoplasm of breast: Secondary | ICD-10-CM | POA: Diagnosis not present

## 2019-08-04 DIAGNOSIS — J9801 Acute bronchospasm: Secondary | ICD-10-CM | POA: Diagnosis not present

## 2019-08-04 DIAGNOSIS — R0602 Shortness of breath: Secondary | ICD-10-CM | POA: Diagnosis not present

## 2019-09-05 DIAGNOSIS — Z01419 Encounter for gynecological examination (general) (routine) without abnormal findings: Secondary | ICD-10-CM | POA: Diagnosis not present

## 2019-09-05 DIAGNOSIS — Z1331 Encounter for screening for depression: Secondary | ICD-10-CM | POA: Diagnosis not present

## 2019-09-05 DIAGNOSIS — Z6824 Body mass index (BMI) 24.0-24.9, adult: Secondary | ICD-10-CM | POA: Diagnosis not present

## 2019-09-05 DIAGNOSIS — E559 Vitamin D deficiency, unspecified: Secondary | ICD-10-CM | POA: Diagnosis not present

## 2019-09-05 DIAGNOSIS — Z79899 Other long term (current) drug therapy: Secondary | ICD-10-CM | POA: Diagnosis not present

## 2019-09-05 DIAGNOSIS — E785 Hyperlipidemia, unspecified: Secondary | ICD-10-CM | POA: Diagnosis not present

## 2019-09-09 DIAGNOSIS — R109 Unspecified abdominal pain: Secondary | ICD-10-CM | POA: Diagnosis not present

## 2019-09-16 DIAGNOSIS — D519 Vitamin B12 deficiency anemia, unspecified: Secondary | ICD-10-CM | POA: Diagnosis not present

## 2019-09-26 DIAGNOSIS — K5730 Diverticulosis of large intestine without perforation or abscess without bleeding: Secondary | ICD-10-CM | POA: Diagnosis not present

## 2019-09-26 DIAGNOSIS — R1032 Left lower quadrant pain: Secondary | ICD-10-CM | POA: Diagnosis not present

## 2019-09-26 DIAGNOSIS — K5900 Constipation, unspecified: Secondary | ICD-10-CM | POA: Diagnosis not present

## 2019-10-06 DIAGNOSIS — J209 Acute bronchitis, unspecified: Secondary | ICD-10-CM | POA: Diagnosis not present

## 2019-10-06 DIAGNOSIS — J449 Chronic obstructive pulmonary disease, unspecified: Secondary | ICD-10-CM | POA: Diagnosis not present

## 2019-10-12 DIAGNOSIS — D519 Vitamin B12 deficiency anemia, unspecified: Secondary | ICD-10-CM | POA: Diagnosis not present

## 2019-11-07 DIAGNOSIS — R1032 Left lower quadrant pain: Secondary | ICD-10-CM | POA: Diagnosis not present

## 2019-11-07 DIAGNOSIS — K5900 Constipation, unspecified: Secondary | ICD-10-CM | POA: Diagnosis not present

## 2019-11-07 DIAGNOSIS — K5730 Diverticulosis of large intestine without perforation or abscess without bleeding: Secondary | ICD-10-CM | POA: Diagnosis not present

## 2019-11-14 DIAGNOSIS — D519 Vitamin B12 deficiency anemia, unspecified: Secondary | ICD-10-CM | POA: Diagnosis not present

## 2019-11-14 DIAGNOSIS — R1032 Left lower quadrant pain: Secondary | ICD-10-CM | POA: Diagnosis not present

## 2019-11-18 DIAGNOSIS — Z79811 Long term (current) use of aromatase inhibitors: Secondary | ICD-10-CM | POA: Diagnosis not present

## 2019-11-18 DIAGNOSIS — Z853 Personal history of malignant neoplasm of breast: Secondary | ICD-10-CM | POA: Diagnosis not present

## 2019-11-18 DIAGNOSIS — C50112 Malignant neoplasm of central portion of left female breast: Secondary | ICD-10-CM | POA: Diagnosis not present

## 2019-12-01 DIAGNOSIS — Z853 Personal history of malignant neoplasm of breast: Secondary | ICD-10-CM | POA: Diagnosis not present

## 2019-12-01 DIAGNOSIS — R922 Inconclusive mammogram: Secondary | ICD-10-CM | POA: Diagnosis not present

## 2019-12-07 DIAGNOSIS — R1032 Left lower quadrant pain: Secondary | ICD-10-CM | POA: Diagnosis not present

## 2019-12-07 DIAGNOSIS — K5730 Diverticulosis of large intestine without perforation or abscess without bleeding: Secondary | ICD-10-CM | POA: Diagnosis not present

## 2019-12-23 DIAGNOSIS — J189 Pneumonia, unspecified organism: Secondary | ICD-10-CM | POA: Diagnosis not present

## 2019-12-23 DIAGNOSIS — R0602 Shortness of breath: Secondary | ICD-10-CM | POA: Diagnosis not present

## 2019-12-23 DIAGNOSIS — Z20828 Contact with and (suspected) exposure to other viral communicable diseases: Secondary | ICD-10-CM | POA: Diagnosis not present

## 2019-12-23 DIAGNOSIS — R05 Cough: Secondary | ICD-10-CM | POA: Diagnosis not present

## 2020-01-10 DIAGNOSIS — E538 Deficiency of other specified B group vitamins: Secondary | ICD-10-CM | POA: Diagnosis not present

## 2020-02-09 DIAGNOSIS — R0602 Shortness of breath: Secondary | ICD-10-CM | POA: Diagnosis not present

## 2020-02-09 DIAGNOSIS — J449 Chronic obstructive pulmonary disease, unspecified: Secondary | ICD-10-CM | POA: Diagnosis not present

## 2020-02-13 DIAGNOSIS — D519 Vitamin B12 deficiency anemia, unspecified: Secondary | ICD-10-CM | POA: Diagnosis not present

## 2020-03-06 DIAGNOSIS — K219 Gastro-esophageal reflux disease without esophagitis: Secondary | ICD-10-CM | POA: Diagnosis not present

## 2020-03-06 DIAGNOSIS — I10 Essential (primary) hypertension: Secondary | ICD-10-CM | POA: Diagnosis not present

## 2020-03-06 DIAGNOSIS — G4700 Insomnia, unspecified: Secondary | ICD-10-CM | POA: Diagnosis not present

## 2020-03-06 DIAGNOSIS — Z634 Disappearance and death of family member: Secondary | ICD-10-CM | POA: Diagnosis not present

## 2020-03-06 DIAGNOSIS — Z6823 Body mass index (BMI) 23.0-23.9, adult: Secondary | ICD-10-CM | POA: Diagnosis not present

## 2020-03-07 DIAGNOSIS — Z853 Personal history of malignant neoplasm of breast: Secondary | ICD-10-CM | POA: Diagnosis not present

## 2020-03-07 DIAGNOSIS — Z79811 Long term (current) use of aromatase inhibitors: Secondary | ICD-10-CM | POA: Diagnosis not present

## 2020-03-13 DIAGNOSIS — D510 Vitamin B12 deficiency anemia due to intrinsic factor deficiency: Secondary | ICD-10-CM | POA: Diagnosis not present

## 2020-04-12 DIAGNOSIS — D510 Vitamin B12 deficiency anemia due to intrinsic factor deficiency: Secondary | ICD-10-CM | POA: Diagnosis not present

## 2020-05-16 DIAGNOSIS — D510 Vitamin B12 deficiency anemia due to intrinsic factor deficiency: Secondary | ICD-10-CM | POA: Diagnosis not present

## 2020-05-31 DIAGNOSIS — I10 Essential (primary) hypertension: Secondary | ICD-10-CM | POA: Diagnosis not present

## 2020-05-31 DIAGNOSIS — R059 Cough, unspecified: Secondary | ICD-10-CM | POA: Diagnosis not present

## 2020-05-31 DIAGNOSIS — J209 Acute bronchitis, unspecified: Secondary | ICD-10-CM | POA: Diagnosis not present

## 2020-05-31 DIAGNOSIS — R0602 Shortness of breath: Secondary | ICD-10-CM | POA: Diagnosis not present

## 2020-06-07 DIAGNOSIS — C50112 Malignant neoplasm of central portion of left female breast: Secondary | ICD-10-CM | POA: Diagnosis not present

## 2020-06-07 DIAGNOSIS — D510 Vitamin B12 deficiency anemia due to intrinsic factor deficiency: Secondary | ICD-10-CM | POA: Diagnosis not present

## 2020-06-17 DIAGNOSIS — N184 Chronic kidney disease, stage 4 (severe): Secondary | ICD-10-CM | POA: Diagnosis not present

## 2020-06-17 DIAGNOSIS — J470 Bronchiectasis with acute lower respiratory infection: Secondary | ICD-10-CM | POA: Diagnosis not present

## 2020-06-17 DIAGNOSIS — J471 Bronchiectasis with (acute) exacerbation: Secondary | ICD-10-CM | POA: Diagnosis not present

## 2020-06-17 DIAGNOSIS — I129 Hypertensive chronic kidney disease with stage 1 through stage 4 chronic kidney disease, or unspecified chronic kidney disease: Secondary | ICD-10-CM | POA: Diagnosis not present

## 2020-06-17 DIAGNOSIS — R0602 Shortness of breath: Secondary | ICD-10-CM | POA: Diagnosis not present

## 2020-06-17 DIAGNOSIS — A310 Pulmonary mycobacterial infection: Secondary | ICD-10-CM | POA: Diagnosis not present

## 2020-06-17 DIAGNOSIS — R059 Cough, unspecified: Secondary | ICD-10-CM | POA: Diagnosis not present

## 2020-06-17 DIAGNOSIS — J208 Acute bronchitis due to other specified organisms: Secondary | ICD-10-CM | POA: Diagnosis not present

## 2020-06-17 IMAGING — MG MM CLIP PLACEMENT
4 series · 4 of 12 positions shown · non-contrast
Comparison: Previous exam(s).

CLINICAL DATA: Status post stereotactic core biopsy of left breast
asymmetry

EXAM:
DIAGNOSTIC LEFT MAMMOGRAM POST STEREOTACTIC BIOPSY

[L ML synth-2D]
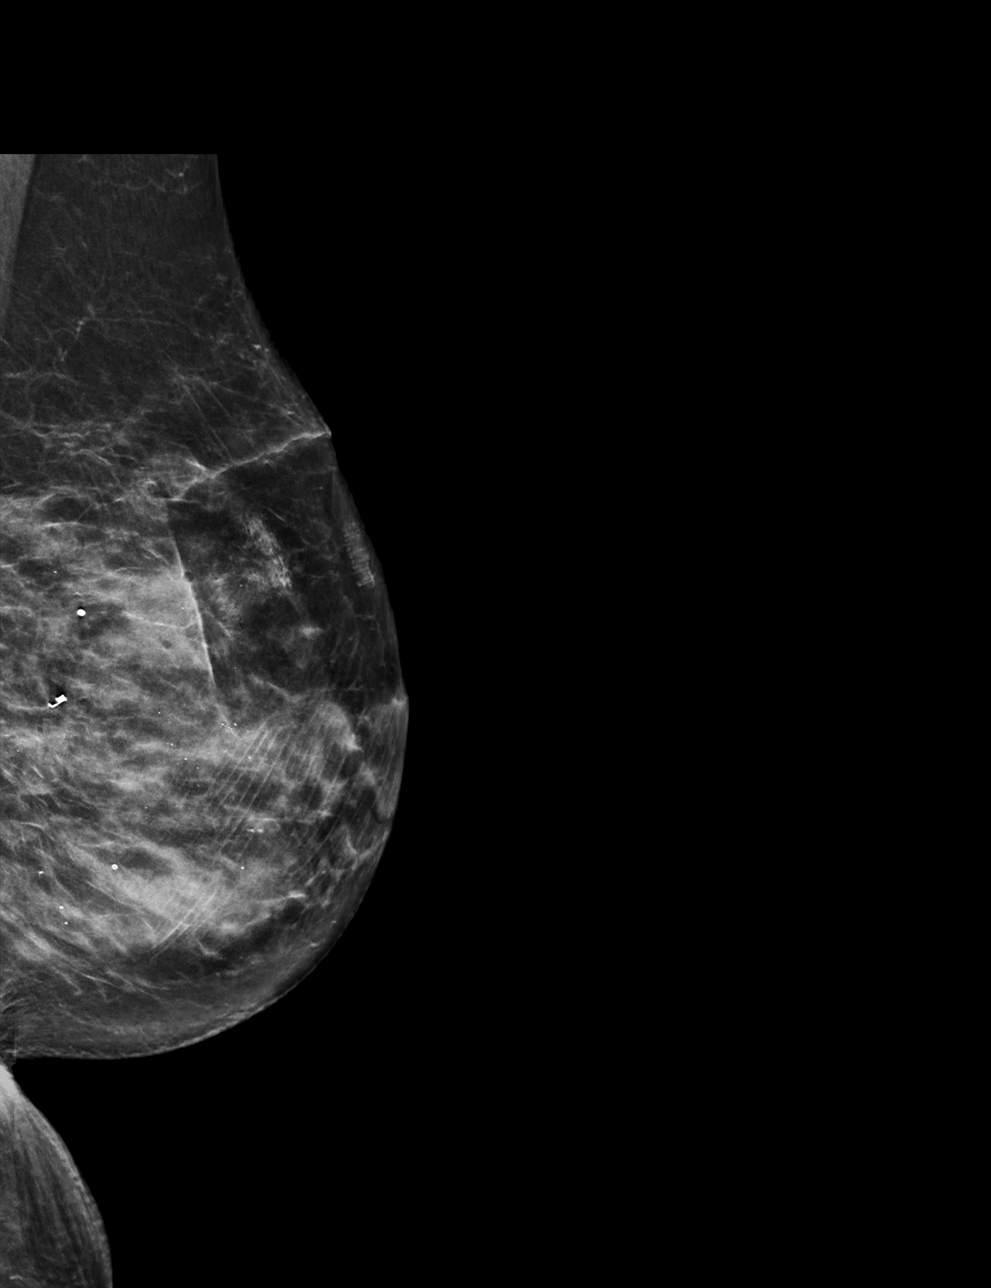

[L CC synth-2D]
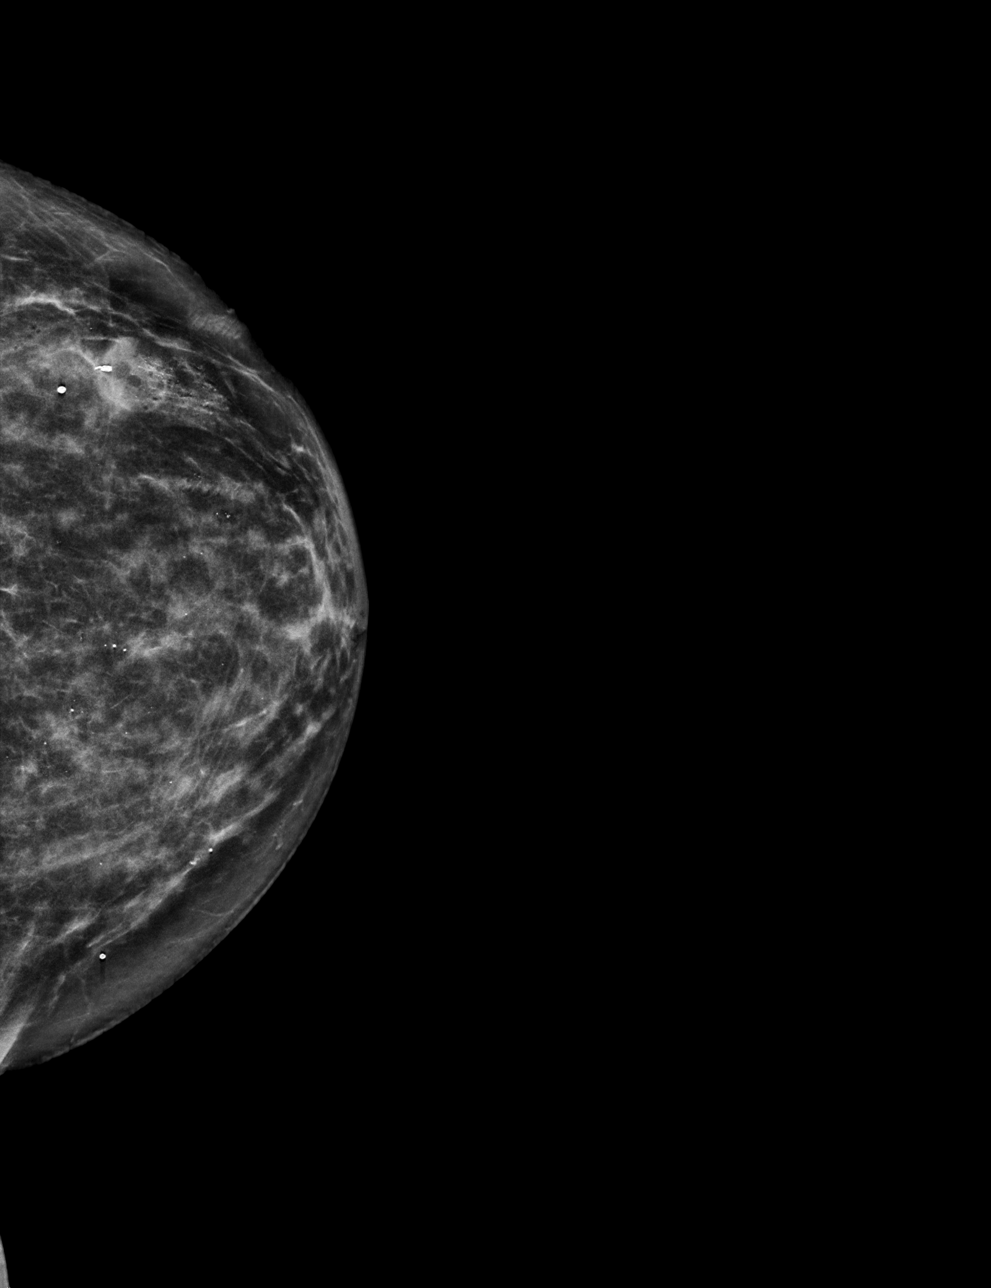

[L ML tomo · tomo slice 31/60.0]
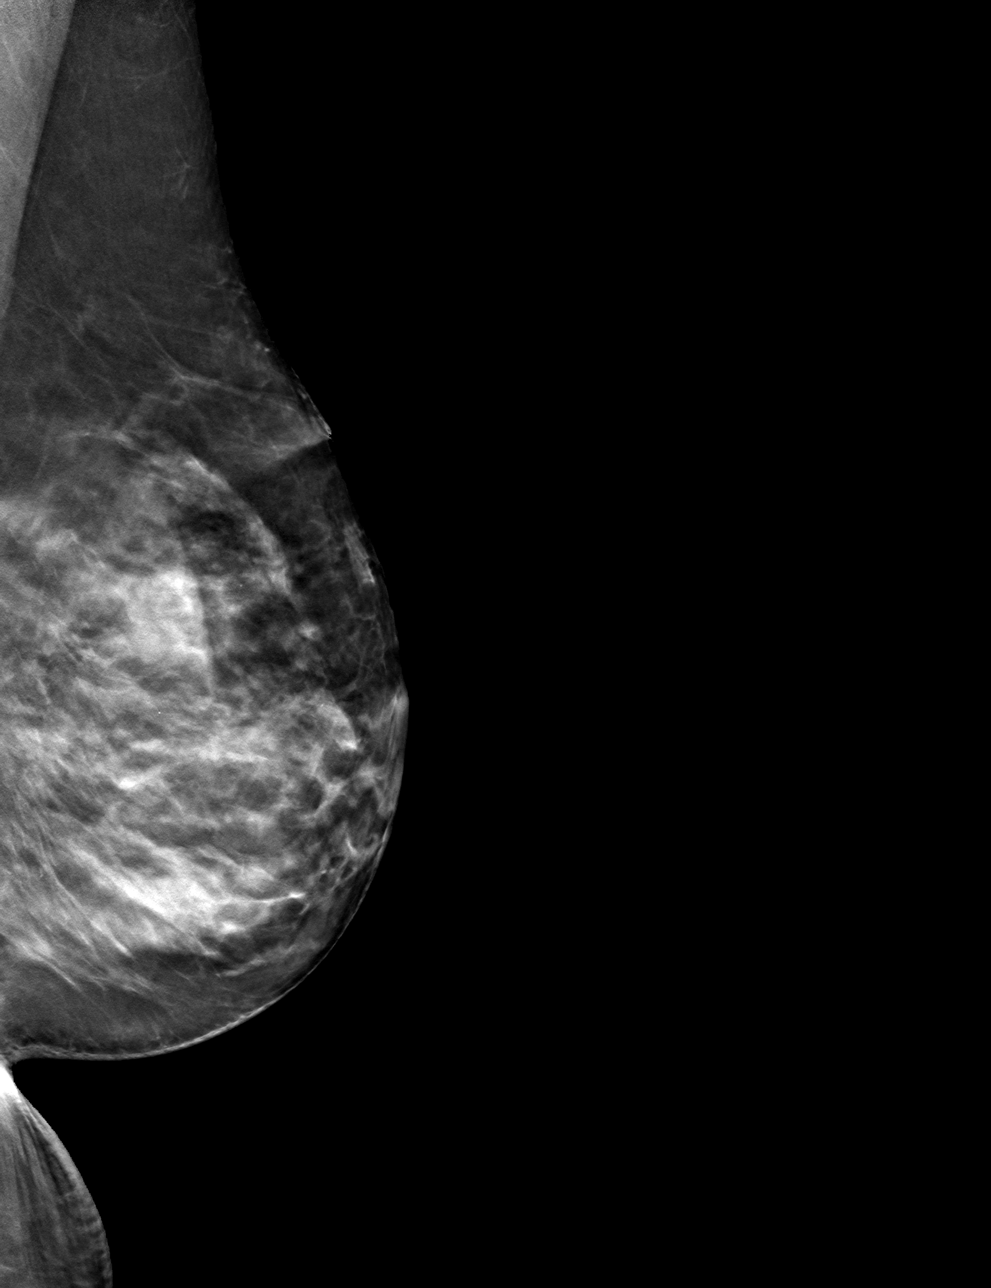

[L CC tomo · tomo slice 27/54.0]
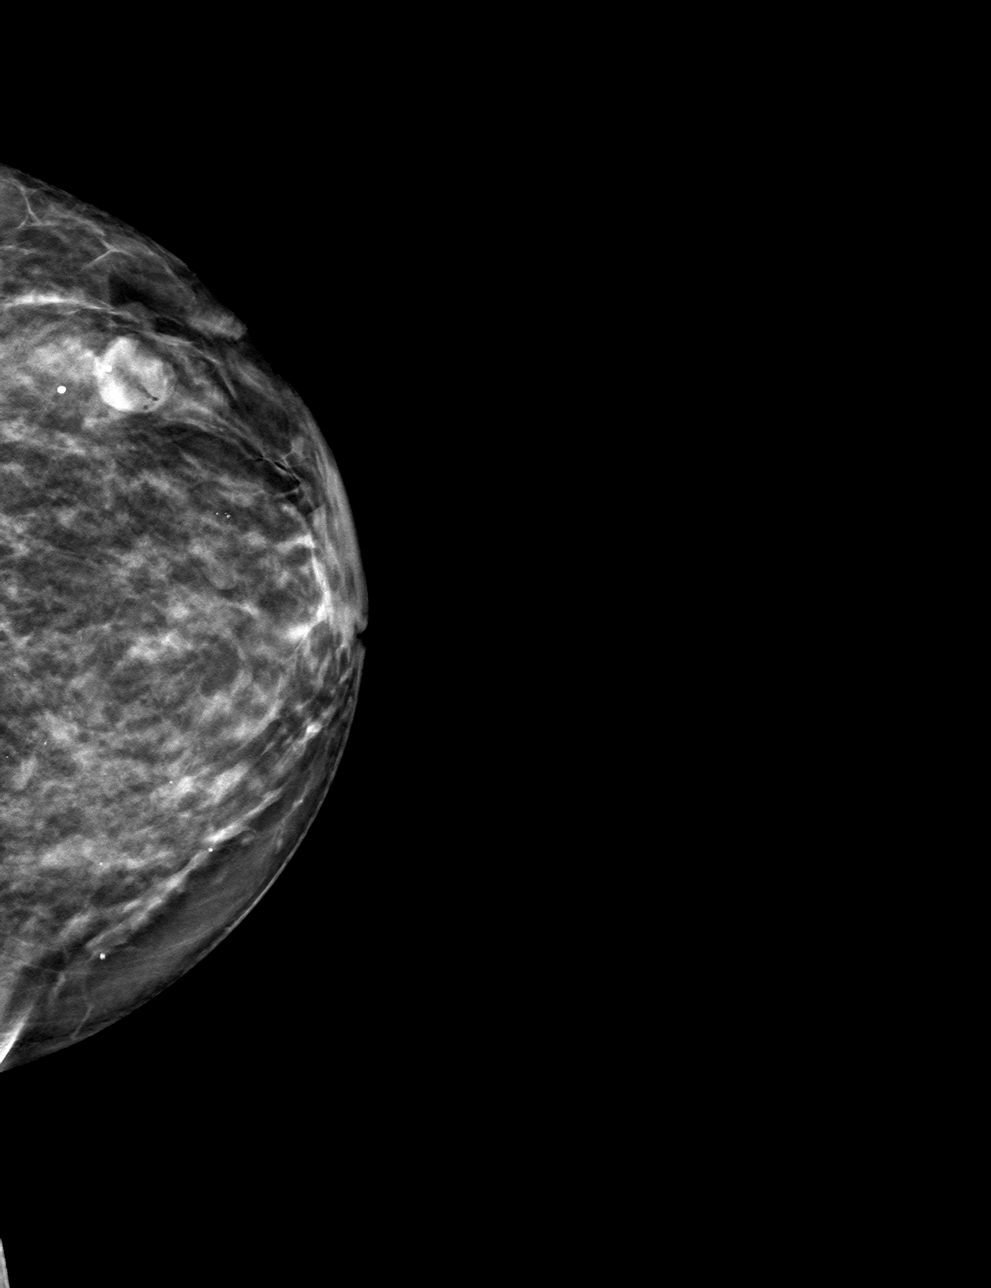

[4 of 12 positions shown; findings below may reference images not displayed]

FINDINGS: Mammographic images were obtained following stereotactic guided
biopsy of asymmetry in the lateral left breast. Cc and lateral views
of the left breast demonstrate coil biopsy clip in the area of
concern.
IMPRESSION: Post biopsy mammogram demonstrating biopsy clip in the expected area
of concern.

Final Assessment: Post Procedure Mammograms for Marker Placement

## 2020-06-17 IMAGING — MG STEREOTACTIC CORE NEEDLE BIOPSY
7 of 17 series · 7 of 33 positions shown · non-contrast
Comparison: Previous exams.
COMPARISON: Previous exams.
COMPARISON: Previous exams.

Addendum:
CLINICAL DATA: Lateral left breast asymmetry for biopsy.

EXAM:
LEFT BREAST STEREOTACTIC CORE NEEDLE BIOPSY

[L (1 of 7)]
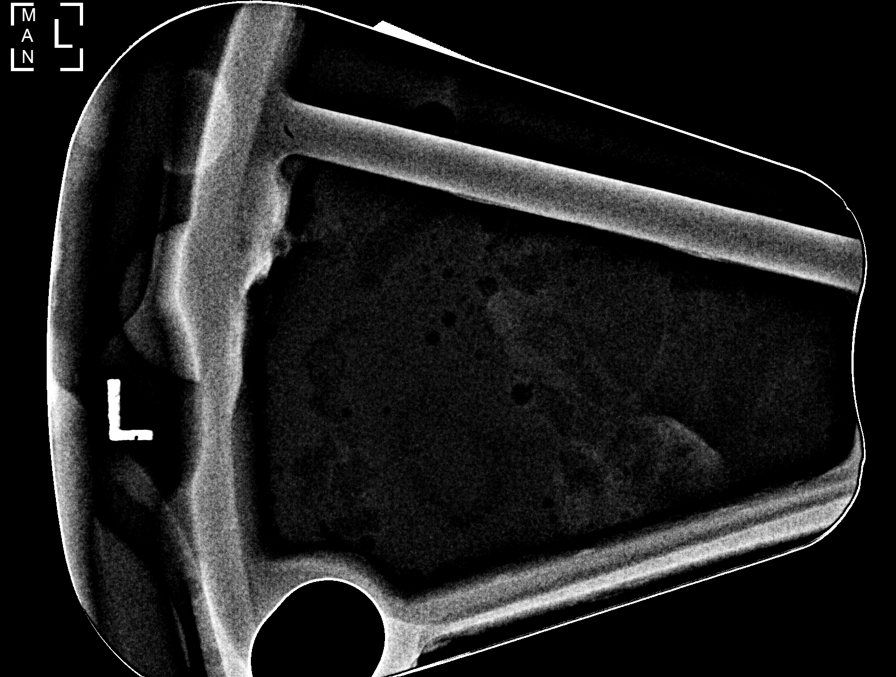

[L (2 of 7)]
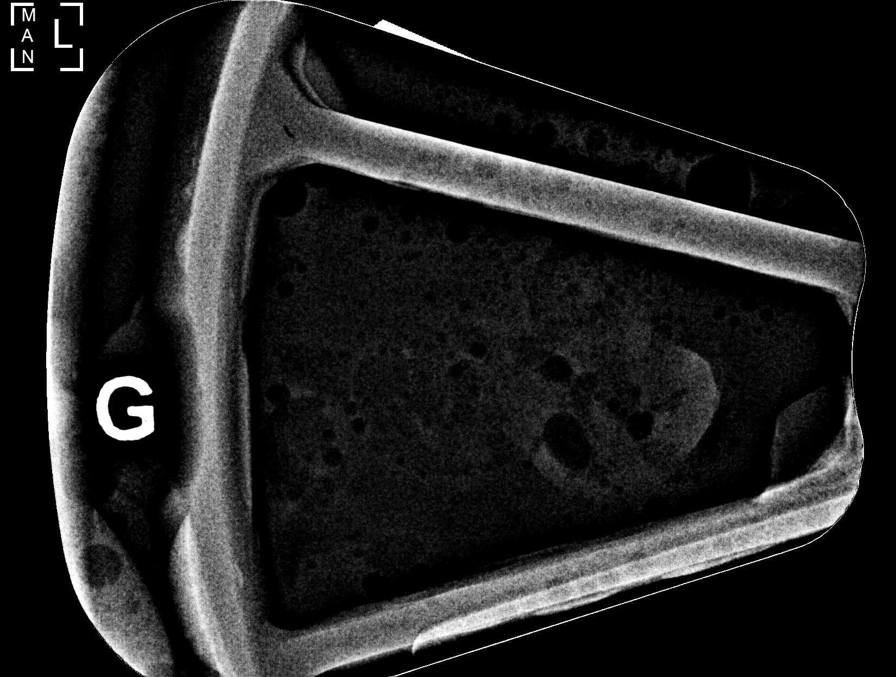

[L (3 of 7)]
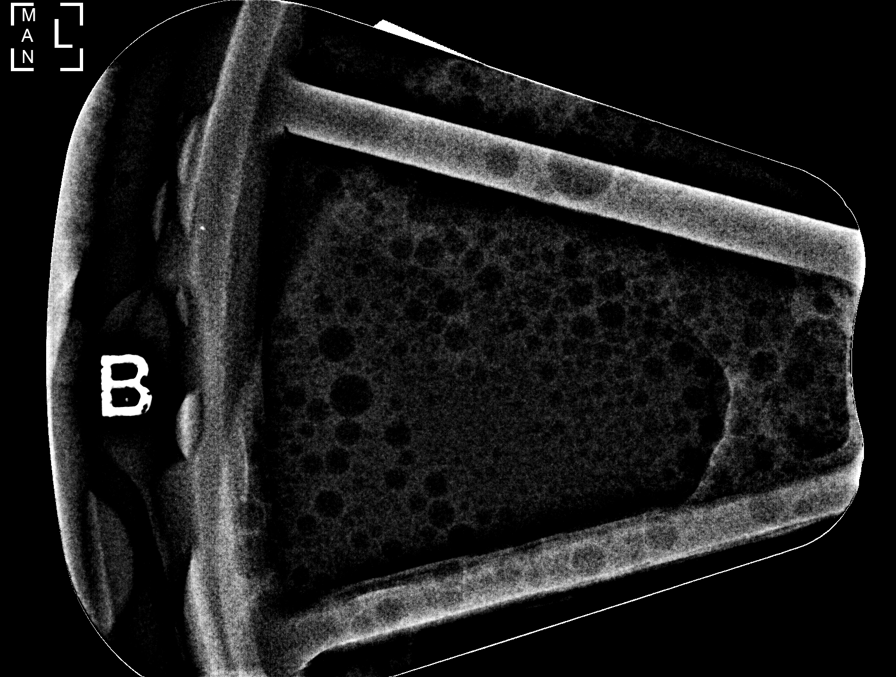

[L (4 of 7)]
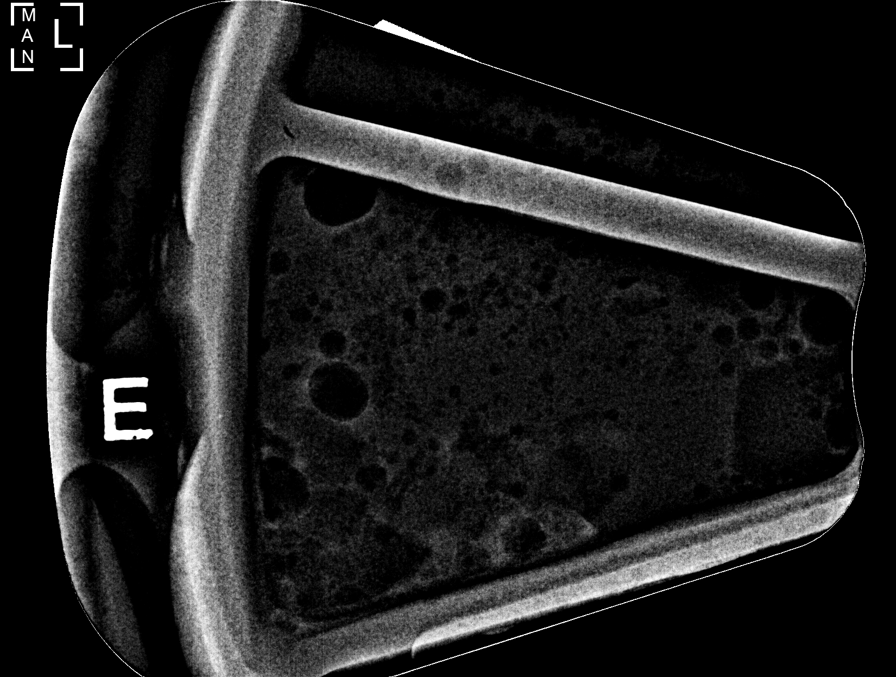

[L (5 of 7)]
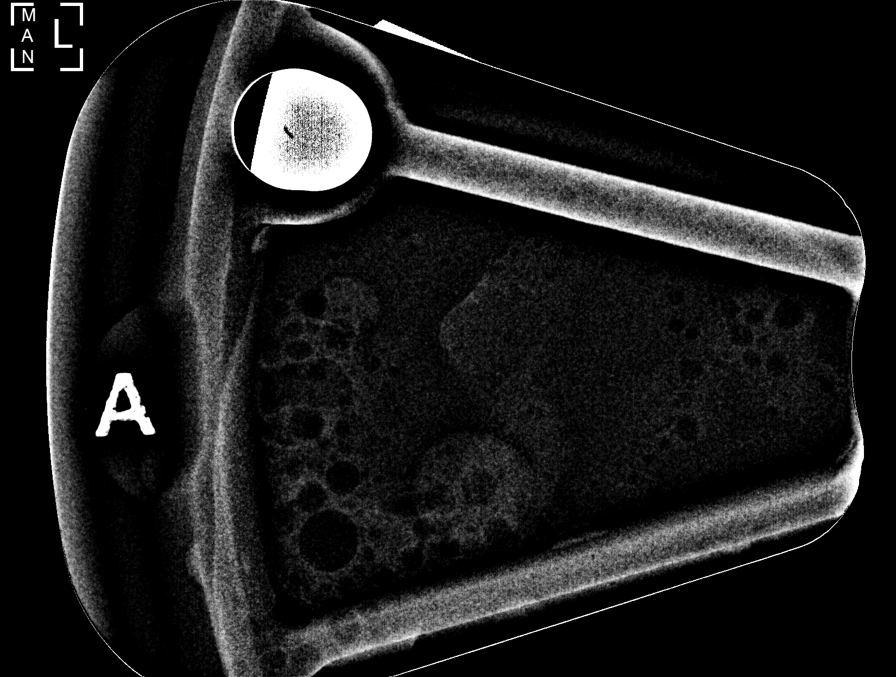

[L (6 of 7)]
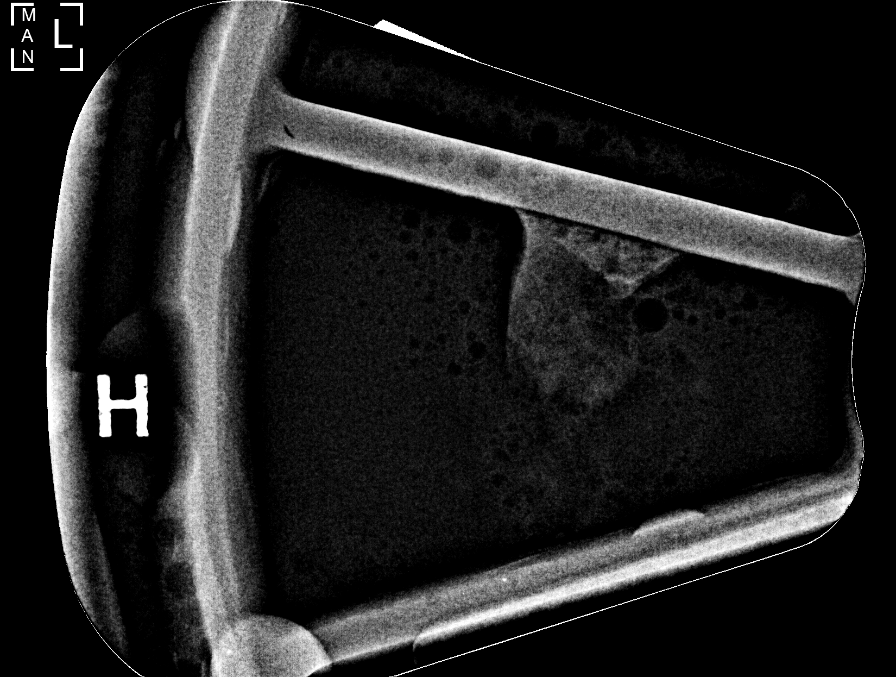

[L (7 of 7)]
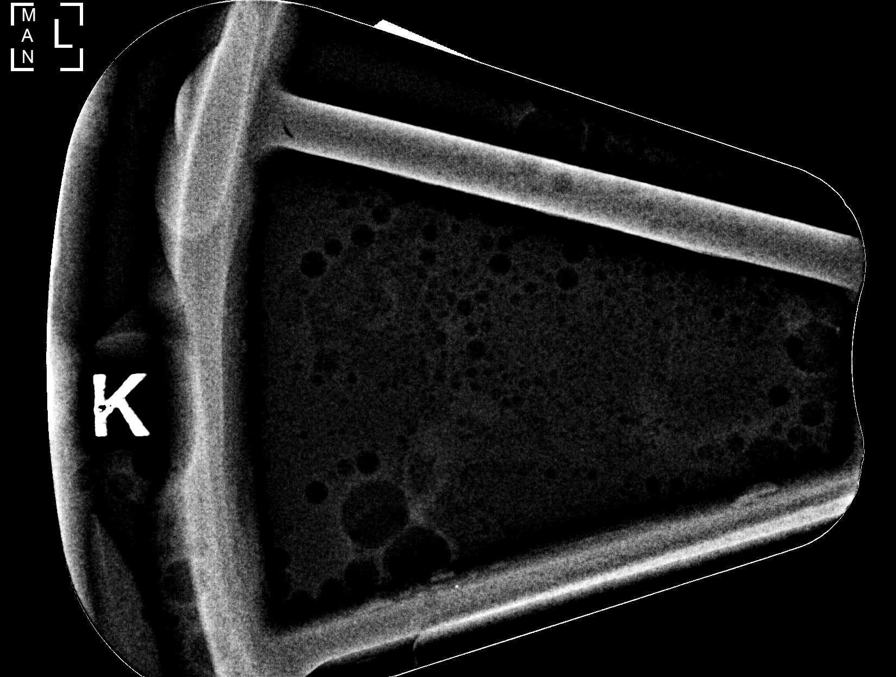

[7 of 33 positions shown; findings below may reference images not displayed]



Using sterile technique and 1% Lidocaine as local anesthetic, under
stereotactic guidance, a 9 gauge vacuum assisted device was used to
perform core needle biopsy of asymmetry in the lateral left breast
using a cranial approach.

Lesion quadrant: Lateral slight lower quadrant

At the conclusion of the procedure, a coil tissue marker clip was
deployed into the biopsy cavity. Follow-up 2-view mammogram was
performed and dictated separately.
IMPRESSION: Stereotactic-guided biopsy of left breast. No apparent
complications.

ADDENDUM:
Pathology of the LEFT breast biopsy revealed INVASIVE DUCTAL
CARCINOMA, GRADE I/II. FIBROCYSTIC CHANGES.

This was found to be concordant by Dr. Radilla.

Recommendation: Surgical referral.

At the patient's request, results and recommendations were relayed
to the patient by phone by Simity, Lizicska ([REDACTED])
on 10/25/2018 at [DATE]. The patient stated she did well following
the biopsy with bruising but no bleeding or pain. Post biopsy
instructions were reviewed with the patient and all of her questions
were answered. She was encouraged to contact the [REDACTED] of
[facility] nurse Daisy was contacted about the results and
recommendations. She stated that Dr. Rafel has also spoken with
the patient and a surgical referral has been placed with Dr. Ebadat
Tiger at [REDACTED] [HOSPITAL] office.

Addendum by Simity, Lizicska on 10/26/2018.

*** End of Addendum ***
Addendum:
FINDINGS: The patient and I discussed the procedure of stereotactic-guided
biopsy including benefits and alternatives. We discussed the high
likelihood of a successful procedure. We discussed the risks of the
procedure including infection, bleeding, tissue injury, clip
migration, and inadequate sampling. Informed written consent was
given. The usual time out protocol was performed immediately prior
to the procedure.

Using sterile technique and 1% Lidocaine as local anesthetic, under
stereotactic guidance, a 9 gauge vacuum assisted device was used to
perform core needle biopsy of asymmetry in the lateral left breast
using a cranial approach.

Lesion quadrant: Lateral slight lower quadrant

At the conclusion of the procedure, a coil tissue marker clip was
deployed into the biopsy cavity. Follow-up 2-view mammogram was
performed and dictated separately.
IMPRESSION: Stereotactic-guided biopsy of left breast. No apparent
complications.

ADDENDUM:
Pathology of the LEFT breast biopsy revealed INVASIVE DUCTAL
CARCINOMA, GRADE I/II. FIBROCYSTIC CHANGES.

This was found to be concordant by Dr. Radilla.

Recommendation: Surgical referral.

At the patient's request, results and recommendations were relayed
to the patient by phone by Simity, Lizicska ([REDACTED])
on 10/25/2018 at [DATE]. The patient stated she did well following
the biopsy with bruising but no bleeding or pain. Post biopsy
instructions were reviewed with the patient and all of her questions
were answered. She was encouraged to contact the [REDACTED] of
[facility] nurse Daisy was contacted about the results and
recommendations. She stated that Dr. Rafel has also spoken with
the patient and a surgical referral has been placed with Dr. Ebadat
Tiger at [REDACTED] [HOSPITAL] office.

Addendum by Simity, Lizicska on 10/26/2018.



Using sterile technique and 1% Lidocaine as local anesthetic, under
stereotactic guidance, a 9 gauge vacuum assisted device was used to
perform core needle biopsy of asymmetry in the lateral left breast
using a cranial approach.

Lesion quadrant: Lateral slight lower quadrant

At the conclusion of the procedure, a coil tissue marker clip was
deployed into the biopsy cavity. Follow-up 2-view mammogram was
performed and dictated separately.
IMPRESSION: Stereotactic-guided biopsy of left breast. No apparent
complications.

## 2020-06-19 DIAGNOSIS — J441 Chronic obstructive pulmonary disease with (acute) exacerbation: Secondary | ICD-10-CM | POA: Diagnosis not present

## 2020-06-19 DIAGNOSIS — J45901 Unspecified asthma with (acute) exacerbation: Secondary | ICD-10-CM | POA: Diagnosis not present

## 2020-06-22 DIAGNOSIS — I10 Essential (primary) hypertension: Secondary | ICD-10-CM | POA: Diagnosis not present

## 2020-06-22 DIAGNOSIS — R0602 Shortness of breath: Secondary | ICD-10-CM | POA: Diagnosis not present

## 2020-06-22 DIAGNOSIS — J45901 Unspecified asthma with (acute) exacerbation: Secondary | ICD-10-CM | POA: Diagnosis not present

## 2020-06-22 DIAGNOSIS — J441 Chronic obstructive pulmonary disease with (acute) exacerbation: Secondary | ICD-10-CM | POA: Diagnosis not present

## 2020-06-29 DIAGNOSIS — J449 Chronic obstructive pulmonary disease, unspecified: Secondary | ICD-10-CM | POA: Diagnosis not present

## 2020-07-12 DIAGNOSIS — D510 Vitamin B12 deficiency anemia due to intrinsic factor deficiency: Secondary | ICD-10-CM | POA: Diagnosis not present

## 2020-08-30 DIAGNOSIS — J209 Acute bronchitis, unspecified: Secondary | ICD-10-CM | POA: Diagnosis not present

## 2020-08-30 DIAGNOSIS — I10 Essential (primary) hypertension: Secondary | ICD-10-CM | POA: Diagnosis not present

## 2020-08-30 DIAGNOSIS — R0602 Shortness of breath: Secondary | ICD-10-CM | POA: Diagnosis not present

## 2020-09-06 DIAGNOSIS — Z6823 Body mass index (BMI) 23.0-23.9, adult: Secondary | ICD-10-CM | POA: Diagnosis not present

## 2020-09-06 DIAGNOSIS — Z79899 Other long term (current) drug therapy: Secondary | ICD-10-CM | POA: Diagnosis not present

## 2020-09-06 DIAGNOSIS — E559 Vitamin D deficiency, unspecified: Secondary | ICD-10-CM | POA: Diagnosis not present

## 2020-09-06 DIAGNOSIS — Z1331 Encounter for screening for depression: Secondary | ICD-10-CM | POA: Diagnosis not present

## 2020-09-06 DIAGNOSIS — Z0000 Encounter for general adult medical examination without abnormal findings: Secondary | ICD-10-CM | POA: Diagnosis not present

## 2020-09-06 DIAGNOSIS — E785 Hyperlipidemia, unspecified: Secondary | ICD-10-CM | POA: Diagnosis not present

## 2020-09-17 DIAGNOSIS — D510 Vitamin B12 deficiency anemia due to intrinsic factor deficiency: Secondary | ICD-10-CM | POA: Diagnosis not present

## 2020-10-02 DIAGNOSIS — H903 Sensorineural hearing loss, bilateral: Secondary | ICD-10-CM | POA: Diagnosis not present

## 2020-10-03 DIAGNOSIS — J329 Chronic sinusitis, unspecified: Secondary | ICD-10-CM | POA: Diagnosis not present

## 2020-10-03 DIAGNOSIS — J302 Other seasonal allergic rhinitis: Secondary | ICD-10-CM | POA: Diagnosis not present

## 2020-10-03 DIAGNOSIS — A310 Pulmonary mycobacterial infection: Secondary | ICD-10-CM | POA: Diagnosis not present

## 2020-10-03 DIAGNOSIS — J40 Bronchitis, not specified as acute or chronic: Secondary | ICD-10-CM | POA: Diagnosis not present

## 2020-10-10 DIAGNOSIS — J209 Acute bronchitis, unspecified: Secondary | ICD-10-CM | POA: Diagnosis not present

## 2020-10-10 DIAGNOSIS — J449 Chronic obstructive pulmonary disease, unspecified: Secondary | ICD-10-CM | POA: Diagnosis not present

## 2020-10-16 DIAGNOSIS — H903 Sensorineural hearing loss, bilateral: Secondary | ICD-10-CM | POA: Diagnosis not present

## 2020-10-17 DIAGNOSIS — D510 Vitamin B12 deficiency anemia due to intrinsic factor deficiency: Secondary | ICD-10-CM | POA: Diagnosis not present

## 2020-10-21 ENCOUNTER — Encounter: Payer: Self-pay | Admitting: Oncology

## 2020-10-21 ENCOUNTER — Other Ambulatory Visit: Payer: Self-pay | Admitting: Oncology

## 2020-10-21 DIAGNOSIS — Z170 Estrogen receptor positive status [ER+]: Secondary | ICD-10-CM

## 2020-10-21 DIAGNOSIS — C50112 Malignant neoplasm of central portion of left female breast: Secondary | ICD-10-CM

## 2020-11-16 DIAGNOSIS — D510 Vitamin B12 deficiency anemia due to intrinsic factor deficiency: Secondary | ICD-10-CM | POA: Diagnosis not present

## 2020-12-03 DIAGNOSIS — R922 Inconclusive mammogram: Secondary | ICD-10-CM | POA: Diagnosis not present

## 2020-12-03 DIAGNOSIS — C50112 Malignant neoplasm of central portion of left female breast: Secondary | ICD-10-CM | POA: Diagnosis not present

## 2020-12-05 ENCOUNTER — Telehealth: Payer: Self-pay | Admitting: Hematology and Oncology

## 2020-12-05 NOTE — Telephone Encounter (Signed)
Patient refescheduled from 4/15 to 8/97 due to conflict in her schedule

## 2020-12-07 ENCOUNTER — Inpatient Hospital Stay: Payer: Medicare HMO | Admitting: Hematology and Oncology

## 2020-12-10 ENCOUNTER — Inpatient Hospital Stay: Attending: Hematology and Oncology | Admitting: Hematology and Oncology

## 2020-12-10 ENCOUNTER — Other Ambulatory Visit: Payer: Self-pay

## 2020-12-10 VITALS — BP 132/63 | HR 83 | Temp 98.1°F | Resp 18 | Ht 65.0 in | Wt 135.7 lb

## 2020-12-10 DIAGNOSIS — Z170 Estrogen receptor positive status [ER+]: Secondary | ICD-10-CM | POA: Diagnosis not present

## 2020-12-10 DIAGNOSIS — M8580 Other specified disorders of bone density and structure, unspecified site: Secondary | ICD-10-CM

## 2020-12-10 DIAGNOSIS — C50112 Malignant neoplasm of central portion of left female breast: Secondary | ICD-10-CM | POA: Diagnosis not present

## 2020-12-10 DIAGNOSIS — Z780 Asymptomatic menopausal state: Secondary | ICD-10-CM | POA: Diagnosis not present

## 2020-12-10 NOTE — Progress Notes (Signed)
[facility]  [address] [place],  [place]  [address] [phone]  Clinic Day:  12/12/2020  Referring physician: Angelina Sheriff, MD   CHIEF COMPLAINT:  CC:   Stage I A hormone receptor positive breast cancer  Current Treatment:   Anastrozole 1 mg daily   HISTORY OF PRESENT ILLNESS:  Jamyia Fortune is a 80 y.o. female with stage IA (T1a N0 M0) hormone receptor positive left breast cancer diagnosed in February 2020.  This was found on screening mammogram, which revealed a possible distortion in the left breast.  It was noted that her breasts were extremely dense.  Left diagnostic mammogram revealed possible persistent architectural distortion in the outer left breast at the middle depth, as well as a possible subtle architectural distortion in the retroareolar area.  Ultrasound of the left breast and axilla was negative.  Stereotactic biopsy revealed an invasive ductal carcinoma grade 1-2 as well as fibrocystic changes.  She was treated with lumpectomy in March 2020.  Final pathology revealed a 5 mm, grade 1, invasive ductal carcinoma.  Margins were clear.  Axilla was clinically negative.  Estrogen and progesterone receptors were positive and HER 2 negative.  Ki 67 was 5%.  There were also proliferative fibrocystic changes and pseudoangiomatous stromal hyperplasia.  In view of the coronavirus pandemic, Dr. Lilia Pro placed her on anastrozole 1 mg daily in April.  We began seeing her in May, at which time she was tolerating anastrozole without significant difficulty.  Bone density scan in May revealed osteopenia with a T-score of -1.3 in the right femur and a T-score of -0.4 in the left forearm, for which she is taking calcium and vitamin-D.  When she was seen in September  2020, she reported an odd sensation in her body daily like an electrical shock, which was felt to potentially be due to anastrozole.  Anastrozole was held for a month, without  improvement in the sensation, so she resumed anastrozole in October 2020.  She had abdominal pain and saw Dr. Lyda Jester. CT abdomen and pelvis in March 2021 did not reveal any acute abnormality or other explanation for her pain.  The abdominal pain later resolved.  Bronchiectasis was seen in the bilateral lungs.  She sees Dr. Verdie  in pulmonology. Bilateral diagnostic mammogram in April 2021 did not reveal any evidence of malignancy.  INTERVAL HISTORY:  Azucena is here today for repeat examination.  She states she continues anastrozole daily without significant difficulty.  She denies any changes in her breasts. She reports a tingling sensation in the left shoulder blade wrapping around the left chest wall for several months.  She denies associated skin rash.  She has a history of pinched nerves in her neck, as well as bilateral lower extremity neuropathy.  She had been seeing a neurologist and had physical therapy, but stopped both when her neck pain improved.  She continues to have the electrical shock sensation in the bilateral arms traveling up into her body. She continues on gabapentin 300 mg, 2 tablets at bedtime.  She feels this helps her peripheral neuropathy.  She still has difficulty sleeping. She reports chronic cough and dyspnea with exertion due to bronchiectasis. She denies fevers or chills. She denies pain. Her appetite is fairly good, but she has lost weight since losing her husband last year. Her weight has decreased 6 pounds over last 6 months. She still is not taking calcium regularly.  Prior to her visit today, she underwent bilateral  diagnostic mammogram.  REVIEW OF SYSTEMS:  Review of Systems  Constitutional: Negative for appetite change, chills, fatigue, fever and unexpected weight change.  HENT:   Negative for lump/mass, mouth sores and sore throat.   Respiratory: Positive for cough (Chronic, stable) and shortness of breath (Chronic, stable).   Cardiovascular: Negative for  chest pain and leg swelling.  Gastrointestinal: Negative for abdominal pain, constipation, diarrhea, nausea and vomiting.  Endocrine: Negative for hot flashes.  Genitourinary: Negative for difficulty urinating, dysuria, frequency and hematuria.   Musculoskeletal: Negative for arthralgias, back pain, myalgias and neck pain.  Skin: Negative for rash.  Neurological: Negative for dizziness and headaches.  Hematological: Negative for adenopathy.  Psychiatric/Behavioral: Positive for sleep disturbance (Difficulty sleeping due to neuropathy). Negative for depression. The patient is not nervous/anxious.      VITALS:  Blood pressure 132/63, pulse 83, temperature 98.1 F (36.7 C), temperature source Oral, resp. rate 18, height 5\' 5"  (1.651 m), weight 135 lb 11.2 oz (61.6 kg), SpO2 94 %.  Wt Readings from Last 3 Encounters:  12/10/20 135 lb 11.2 oz (61.6 kg)  05/01/15 142 lb 12.8 oz (64.8 kg)  02/20/15 142 lb (64.4 kg)    Body mass index is 22.58 kg/m.  Performance status (ECOG): 1 - Symptomatic but completely ambulatory  PHYSICAL EXAM:  Physical Exam Vitals and nursing note reviewed.  Constitutional:      General: She is not in acute distress.    Appearance: Normal appearance.  HENT:     Head: Normocephalic and atraumatic.     Mouth/Throat:     Mouth: Mucous membranes are moist.     Pharynx: Oropharynx is clear. No oropharyngeal exudate or posterior oropharyngeal erythema.  Eyes:     General: No scleral icterus.    Extraocular Movements: Extraocular movements intact.     Conjunctiva/sclera: Conjunctivae normal.     Pupils: Pupils are equal, round, and reactive to light.  Cardiovascular:     Rate and Rhythm: Normal rate and regular rhythm.     Heart sounds: Normal heart sounds. No murmur heard. No friction rub. No gallop.   Pulmonary:     Effort: Pulmonary effort is normal.     Breath sounds: Normal breath sounds. No wheezing, rhonchi or rales.  Chest:  Breasts:     Right:  Normal. No swelling, bleeding, inverted nipple, mass, nipple discharge, skin change, tenderness, axillary adenopathy or supraclavicular adenopathy.     Left: Normal. No swelling, bleeding, inverted nipple, mass, nipple discharge, skin change, tenderness, axillary adenopathy or supraclavicular adenopathy.    Abdominal:     General: There is no distension.     Palpations: Abdomen is soft. There is no hepatomegaly, splenomegaly or mass.     Tenderness: There is no abdominal tenderness.  Musculoskeletal:        General: Normal range of motion.     Cervical back: Normal range of motion and neck supple. No tenderness.     Right lower leg: No edema.     Left lower leg: No edema.  Lymphadenopathy:     Cervical: No cervical adenopathy.     Upper Body:     Right upper body: No supraclavicular or axillary adenopathy.     Left upper body: No supraclavicular or axillary adenopathy.     Lower Body: No right inguinal adenopathy. No left inguinal adenopathy.  Skin:    General: Skin is warm and dry.     Coloration: Skin is not jaundiced.     Findings: No  rash.  Neurological:     Mental Status: She is alert and oriented to person, place, and time.     Cranial Nerves: No cranial nerve deficit.  Psychiatric:        Mood and Affect: Mood normal.        Behavior: Behavior normal.        Thought Content: Thought content normal.    LABS:  No flowsheet data found. No flowsheet data found.   No results found for: CEA1 / No results found for: CEA1 No results found for: PSA1 No results found for: OZH086 No results found for: CAN125  No results found for: TOTALPROTELP, ALBUMINELP, A1GS, A2GS, BETS, BETA2SER, GAMS, MSPIKE, SPEI No results found for: TIBC, FERRITIN, IRONPCTSAT No results found for: LDH  STUDIES:  No results found.  Exam(s): [id number] MAM/MAM DIGITAL W/TOMO DIAG B CLINICAL DATA:  History of a left lumpectomy breast carcinoma on 11/16/2018. No current breast  complaints.  EXAM: DIGITAL DIAGNOSTIC BILATERAL MAMMOGRAM WITH TOMOSYNTHESIS AND CAD  TECHNIQUE: Bilateral digital diagnostic mammography and breast tomosynthesis was performed. The images were evaluated with computer-aided detection.  COMPARISON:  Previous exam(s).  ACR Breast Density Category c: The breast tissue is heterogeneously dense, which may obscure small masses.  FINDINGS: Postsurgical changes in the left breast are stable from the most recent prior exam.  There are no masses or areas of nonsurgical architectural distortion. There are no suspicious calcifications.  IMPRESSION: 1. No evidence of new or recurrent breast carcinoma. 2. Benign post lumpectomy changes on the left.  RECOMMENDATION: Screening mammogram in one year.(Code:[id number])   BI-RADS CATEGORY  2: Benign.  HISTORY:   Past Medical History:  Diagnosis Date  . Asthma   . Benign essential hypertension   . Elevated cholesterol   . Gout   . History of pneumonia   . Idiopathic neuropathy   . Insomnia   . Osteopenia   . Renal insufficiency   . Vitamin D insufficiency     Past Surgical History:  Procedure Laterality Date  . APPENDECTOMY    . TONSILLECTOMY    . VESICOVAGINAL FISTULA CLOSURE W/ TAH      Family History  Problem Relation Age of Onset  . Coronary artery disease Mother   . Stroke Other   . Brain cancer Father   . Emphysema Father   . Rheum arthritis Sister   . Diabetes Other   . Heart disease Sister   . Breast cancer Sister     Social History:  reports that she quit smoking about 26 years ago. Her smoking use included cigarettes. She has a 5.00 pack-year smoking history. She has never used smokeless tobacco. She reports that she does not drink alcohol and does not use drugs.The patient is alone today.  Allergies:  Allergies  Allergen Reactions  . Codeine     Nausea, vomiting  . Hydrocodone     vomting  . Penicillins     Rash, itch  . Prednisone     Can tolerate  medication but doesn't like taking it    Current Medications: Current Outpatient Medications  Medication Sig Dispense Refill  . allopurinol (ZYLOPRIM) 100 MG tablet Take 100 mg by mouth 2 (two) times daily.    Marland Kitchen anastrozole (ARIMIDEX) 1 MG tablet TAKE 1 TABLET EVERY DAY 90 tablet 3  . azithromycin (ZITHROMAX) 250 MG tablet Take 500 mg by mouth 3 (three) times a week.    . Cyanocobalamin (VITAMIN B-12 IJ) Inject 1,000 mcg as directed every 30 (  thirty) days.    Marland Kitchen estradiol (ESTRACE) 1 MG tablet Take 1 mg by mouth daily.    Marland Kitchen ethambutol (MYAMBUTOL) 400 MG tablet Take 3 tablets three times weekly 108 tablet 2  . fluticasone (FLONASE) 50 MCG/ACT nasal spray Place 2 sprays into both nostrils daily.    Marland Kitchen gabapentin (NEURONTIN) 300 MG capsule Take 600 mg by mouth. Take 2 tablet at bedtime.    Marland Kitchen losartan (COZAAR) 50 MG tablet Take 50 mg by mouth daily.    Marland Kitchen omeprazole (PRILOSEC) 20 MG capsule Take 20 mg by mouth 2 (two) times daily.     . rifampin (RIFADIN) 300 MG capsule Take 2 capsules three times weekly 72 capsule 2  . Vitamin D, Ergocalciferol, (DRISDOL) 50000 UNITS CAPS capsule Take 1 capsule by mouth. Twice weekly     No current facility-administered medications for this visit.     ASSESSMENT & PLAN:   Assessment:  1. Stage IA hormone receptor positive breast cancer diagnosed in February 2020.  She remains without evidence of recurrence.  She knows to continue anastrozole daily for least a total of 5 years.  2. Osteopenia, for which she is on calcium and vitamin-D. I encouraged her to take her calcium daily. She will be due for repeat bone density scan in May 2022.  3. Bronchiectasis of uncertain etiology, she continues to follow with Dr. Verdie Dev Dhondt. 4. Paresthesias of the arms and now left upper back, most likely due to nerve impingement in her neck.  Plan:  She knows to continue anastrozole daily. I will schedule her bone density scan in May and plan to contact her with the results.  We  will plan to see her back in 6 months for re-examination.  The patient understands the plans discussed today and is in agreement with them.  She knows to contact our office if she develops concerns prior to her next appointment.     Marvia Pickles, PA-C

## 2020-12-12 ENCOUNTER — Encounter: Payer: Self-pay | Admitting: Hematology and Oncology

## 2021-01-07 DIAGNOSIS — R0602 Shortness of breath: Secondary | ICD-10-CM | POA: Diagnosis not present

## 2021-01-07 DIAGNOSIS — J449 Chronic obstructive pulmonary disease, unspecified: Secondary | ICD-10-CM | POA: Diagnosis not present

## 2021-01-09 DIAGNOSIS — D510 Vitamin B12 deficiency anemia due to intrinsic factor deficiency: Secondary | ICD-10-CM | POA: Diagnosis not present

## 2021-01-10 DIAGNOSIS — I10 Essential (primary) hypertension: Secondary | ICD-10-CM | POA: Diagnosis not present

## 2021-01-10 DIAGNOSIS — H9190 Unspecified hearing loss, unspecified ear: Secondary | ICD-10-CM | POA: Diagnosis not present

## 2021-01-10 DIAGNOSIS — Z6822 Body mass index (BMI) 22.0-22.9, adult: Secondary | ICD-10-CM | POA: Diagnosis not present

## 2021-02-11 DIAGNOSIS — D510 Vitamin B12 deficiency anemia due to intrinsic factor deficiency: Secondary | ICD-10-CM | POA: Diagnosis not present

## 2021-02-20 DIAGNOSIS — H903 Sensorineural hearing loss, bilateral: Secondary | ICD-10-CM | POA: Diagnosis not present

## 2021-03-05 DIAGNOSIS — H52223 Regular astigmatism, bilateral: Secondary | ICD-10-CM | POA: Diagnosis not present

## 2021-03-05 DIAGNOSIS — H524 Presbyopia: Secondary | ICD-10-CM | POA: Diagnosis not present

## 2021-03-05 DIAGNOSIS — H5201 Hypermetropia, right eye: Secondary | ICD-10-CM | POA: Diagnosis not present

## 2021-03-05 DIAGNOSIS — Z961 Presence of intraocular lens: Secondary | ICD-10-CM | POA: Diagnosis not present

## 2021-03-05 DIAGNOSIS — H35363 Drusen (degenerative) of macula, bilateral: Secondary | ICD-10-CM | POA: Diagnosis not present

## 2021-03-14 DIAGNOSIS — E538 Deficiency of other specified B group vitamins: Secondary | ICD-10-CM | POA: Diagnosis not present

## 2021-04-03 DIAGNOSIS — H903 Sensorineural hearing loss, bilateral: Secondary | ICD-10-CM | POA: Diagnosis not present

## 2021-04-08 DIAGNOSIS — J449 Chronic obstructive pulmonary disease, unspecified: Secondary | ICD-10-CM | POA: Diagnosis not present

## 2021-04-08 DIAGNOSIS — K219 Gastro-esophageal reflux disease without esophagitis: Secondary | ICD-10-CM | POA: Diagnosis not present

## 2021-04-08 DIAGNOSIS — R0602 Shortness of breath: Secondary | ICD-10-CM | POA: Diagnosis not present

## 2021-04-11 DIAGNOSIS — D510 Vitamin B12 deficiency anemia due to intrinsic factor deficiency: Secondary | ICD-10-CM | POA: Diagnosis not present

## 2021-04-22 DIAGNOSIS — J449 Chronic obstructive pulmonary disease, unspecified: Secondary | ICD-10-CM | POA: Diagnosis not present

## 2021-04-30 DIAGNOSIS — R590 Localized enlarged lymph nodes: Secondary | ICD-10-CM | POA: Diagnosis not present

## 2021-04-30 DIAGNOSIS — K219 Gastro-esophageal reflux disease without esophagitis: Secondary | ICD-10-CM | POA: Diagnosis not present

## 2021-04-30 DIAGNOSIS — Z6821 Body mass index (BMI) 21.0-21.9, adult: Secondary | ICD-10-CM | POA: Diagnosis not present

## 2021-05-13 DIAGNOSIS — R053 Chronic cough: Secondary | ICD-10-CM | POA: Diagnosis not present

## 2021-05-13 DIAGNOSIS — Z6822 Body mass index (BMI) 22.0-22.9, adult: Secondary | ICD-10-CM | POA: Diagnosis not present

## 2021-05-13 DIAGNOSIS — I10 Essential (primary) hypertension: Secondary | ICD-10-CM | POA: Diagnosis not present

## 2021-05-13 DIAGNOSIS — A310 Pulmonary mycobacterial infection: Secondary | ICD-10-CM | POA: Diagnosis not present

## 2021-05-13 DIAGNOSIS — N189 Chronic kidney disease, unspecified: Secondary | ICD-10-CM | POA: Diagnosis not present

## 2021-06-05 NOTE — Progress Notes (Signed)
[facility]  [address] [place],    [address] [phone]  Clinic Day:  06/11/2021  Referring physician: Angelina Sheriff, MD  This document serves as a record of services personally performed by Hosie Poisson, MD. It was created on their behalf by [facility], a trained medical scribe. The creation of this record is based on the scribe's personal observations and the provider's statements to them.  CHIEF COMPLAINT:  CC:   Stage I A hormone receptor positive breast cancer  Current Treatment:   Anastrozole 1 mg daily   HISTORY OF PRESENT ILLNESS:  Nichole Gray is a 80 y.o. female with stage IA (T1a N0 M0) hormone receptor positive left breast cancer diagnosed in February 2020.  This was found on screening mammogram, which revealed a possible distortion in the left breast.  It was noted that her breasts were extremely dense.  Left diagnostic mammogram revealed possible persistent architectural distortion in the outer left breast at the middle depth, as well as a possible subtle architectural distortion in the retroareolar area.  Ultrasound of the left breast and axilla was negative.  Stereotactic biopsy revealed an invasive ductal carcinoma grade 1-2 as well as fibrocystic changes.  She was treated with lumpectomy in March 2020.  Final pathology revealed a 5 mm, grade 1, invasive ductal carcinoma.  Margins were clear.  Axilla was clinically negative.  Estrogen and progesterone receptors were positive and HER 2 negative.  Ki 67 was 5%.  There were also proliferative fibrocystic changes and pseudoangiomatous stromal hyperplasia.  In view of the coronavirus pandemic, Dr. Lilia Pro placed her on anastrozole 1 mg daily in April.  We began seeing her in May, at which time she was tolerating anastrozole without significant difficulty.  Bone density scan in May revealed osteopenia with a T-score of -1.3 in the right femur and a T-score of -0.4  in the left forearm, for which she is taking calcium and vitamin-D.  When she was seen in September  2020, she reported an odd sensation in her body daily like an electrical shock, which was felt to potentially be due to anastrozole.  Anastrozole was held for a month, without improvement in the sensation, so she resumed anastrozole in October 2020.  She had abdominal pain and saw Dr. Lyda Jester. CT abdomen and pelvis in March 2021 did not reveal any acute abnormality or other explanation for her pain.  The abdominal pain later resolved.  Bronchiectasis was seen in the bilateral lungs.  She sees Dr. Verdie Mosher in pulmonology.   INTERVAL HISTORY:  Mystery is here for routine follow up and states that she has been fairly well. She states that she feels as if there is "a lump" in her throat. She also has had some dysphagia and hoarseness. She was a previous smoker. We will obtain CT imaging for further evaluation. She may need referral to ENT. She states that she has had some pain of the left lower quadrant, which occurs intermittently. This has been evaluated previously by Dr. Lyda Jester with CT imaging, which was negative. This may be representative of adhesions from prior hysterectomy. She continues anastrozole daily without significant difficulty. Bilateral diagnostic mammogram from April 2022 did not reveal any evidence of malignancy. Her  appetite is good, and she has gained 4 pounds since her last visit.  She denies fever, chills or other signs of infection.  She denies nausea, vomiting and bowel issues.  She denies sore throat, cough, dyspnea, or chest  pain.  REVIEW OF SYSTEMS:  Review of Systems  Constitutional: Negative.  Negative for appetite change, chills, fatigue, fever and unexpected weight change.  HENT:   Positive for trouble swallowing and voice change (hoarseness).        Sensation of a lump in her throat  Eyes: Negative.   Respiratory: Negative.  Negative for chest tightness, cough,  hemoptysis, shortness of breath and wheezing.   Cardiovascular: Negative.  Negative for chest pain, leg swelling and palpitations.  Gastrointestinal:  Positive for abdominal pain (intermittent of the left lower quadrant). Negative for abdominal distention, blood in stool, constipation, diarrhea, nausea and vomiting.  Endocrine: Negative.   Genitourinary: Negative.  Negative for difficulty urinating, dysuria, frequency and hematuria.   Musculoskeletal: Negative.  Negative for arthralgias, back pain, flank pain, gait problem and myalgias.  Skin: Negative.   Neurological: Negative.  Negative for dizziness, extremity weakness, gait problem, headaches, light-headedness, numbness, seizures and speech difficulty.  Hematological: Negative.   Psychiatric/Behavioral: Negative.  Negative for depression and sleep disturbance. The patient is not nervous/anxious.     VITALS:  Blood pressure (!) 122/59, pulse 66, temperature (!) 97.4 F (36.3 C), temperature source Oral, resp. rate 18, height 5\' 5"  (1.651 m), weight 140 lb 4.8 oz (63.6 kg), SpO2 97 %.  Wt Readings from Last 3 Encounters:  06/11/21 140 lb 4.8 oz (63.6 kg)  12/10/20 135 lb 11.2 oz (61.6 kg)  05/01/15 142 lb 12.8 oz (64.8 kg)    Body mass index is 23.35 kg/m.  Performance status (ECOG): 1 - Symptomatic but completely ambulatory  PHYSICAL EXAM:  Physical Exam Constitutional:      General: She is not in acute distress.    Appearance: Normal appearance. She is normal weight.  HENT:     Head: Normocephalic and atraumatic.  Eyes:     General: No scleral icterus.    Extraocular Movements: Extraocular movements intact.     Conjunctiva/sclera: Conjunctivae normal.     Pupils: Pupils are equal, round, and reactive to light.  Cardiovascular:     Rate and Rhythm: Normal rate and regular rhythm.     Pulses: Normal pulses.     Heart sounds: Normal heart sounds. No murmur heard.   No friction rub. No gallop.  Pulmonary:     Effort:  Pulmonary effort is normal. No respiratory distress.     Breath sounds: Normal breath sounds.  Chest:  Breasts:    Right: Normal.     Left: Normal.     Comments: Well healed faint scar in the lateral left breast. Both breasts are without masses. Abdominal:     General: Bowel sounds are normal. There is no distension.     Palpations: Abdomen is soft. There is no hepatomegaly, splenomegaly or mass.     Tenderness: There is no abdominal tenderness.  Musculoskeletal:        General: Normal range of motion.     Cervical back: Normal range of motion and neck supple.     Right lower leg: No edema.     Left lower leg: No edema.  Lymphadenopathy:     Cervical: No cervical adenopathy.  Skin:    General: Skin is warm and dry.  Neurological:     General: No focal deficit present.     Mental Status: She is alert and oriented to person, place, and time. Mental status is at baseline.  Psychiatric:        Mood and Affect: Mood normal.  Behavior: Behavior normal.        Thought Content: Thought content normal.        Judgment: Judgment normal.   LABS:  No flowsheet data found. No flowsheet data found.   STUDIES:  No results found.   EXAM: 12/03/2020 DIGITAL DIAGNOSTIC BILATERAL MAMMOGRAM WITH TOMOSYNTHESIS AND CAD  TECHNIQUE: Bilateral digital diagnostic mammography and breast tomosynthesis was performed. The images were evaluated with computer-aided detection.  COMPARISON: Previous exam(s).  ACR Breast Density Category c: The breast tissue is heterogeneously dense, which may obscure small masses.  FINDINGS: Postsurgical changes in the left breast are stable from the most recent prior exam. There are no masses or areas of nonsurgical architectural distortion. There are no suspicious calcifications.  IMPRESSION: 1. No evidence of new or recurrent breast carcinoma. 2. Benign post lumpectomy changes on the left.  HISTORY:   Allergies:  Allergies  Allergen Reactions    Codeine Nausea And Vomiting and Rash    Nausea, vomiting Nausea, vomiting    Penicillins Rash    Rash, itch Other reaction(s): Other Pt doesn't remember reaction because it was long ago Rash, itch    Hydrocodone Rash    vomting vomting   Prednisone Rash    Can tolerate medication but doesn't like taking it Can tolerate medication but doesn't like taking it    Current Medications: Current Outpatient Medications  Medication Sig Dispense Refill   allopurinol (ZYLOPRIM) 100 MG tablet Take 100 mg by mouth 2 (two) times daily.     anastrozole (ARIMIDEX) 1 MG tablet TAKE 1 TABLET EVERY DAY 90 tablet 3   Cyanocobalamin (VITAMIN B-12 IJ) Inject 1,000 mcg as directed every 30 (thirty) days.     estradiol (ESTRACE) 1 MG tablet Take 1 mg by mouth daily.     ethambutol (MYAMBUTOL) 400 MG tablet Take 3 tablets three times weekly 108 tablet 2   fluticasone (FLONASE) 50 MCG/ACT nasal spray Place 2 sprays into both nostrils daily.     gabapentin (NEURONTIN) 300 MG capsule Take 600 mg by mouth. Take 2 tablet at bedtime.     losartan (COZAAR) 50 MG tablet Take 50 mg by mouth daily.     omeprazole (PRILOSEC) 20 MG capsule Take 20 mg by mouth 2 (two) times daily.      rifampin (RIFADIN) 300 MG capsule Take 2 capsules three times weekly 72 capsule 2   Vitamin D, Ergocalciferol, (DRISDOL) 50000 UNITS CAPS capsule Take 1 capsule by mouth. Twice weekly     No current facility-administered medications for this visit.   ASSESSMENT & PLAN:   Assessment:  1. Stage IA hormone receptor positive breast cancer diagnosed in February 2020.  She remains without evidence of recurrence.  She knows to continue anastrozole daily for least a total of 5 years, until April 2025.   2. Osteopenia, for which she is on calcium and vitamin-D. I encouraged her to take her calcium daily. We will schedule repeat bone density scan now.   3. Globus sensation, dysphagia and hoarseness. We will evaluate with CT neck for  completeness. She may need to be evaluated by ENT if this persists.  Plan:   She knows to continue anastrozole daily. I will schedule her bone density scan now as well as CT soft tissue neck for further evaluation of her symptoms of globus sensation, dysphagia and hoarseness.  Otherwise, we will plan to see her back in 6 months with bilateral mammogram for re-examination.  The patient understands the plans discussed today and  is in agreement with them.  She knows to contact our office if she develops concerns prior to her next appointment.   I provided 15 minutes of face-to-face time during this this encounter and > 50% was spent counseling as documented under my assessment and plan.    I, Rita Ohara, am acting as scribe for Derwood Kaplan, MD  I have reviewed this report as typed by the medical scribe, and it is complete and accurate.

## 2021-06-11 ENCOUNTER — Encounter: Payer: Self-pay | Admitting: Oncology

## 2021-06-11 ENCOUNTER — Telehealth: Payer: Self-pay | Admitting: Oncology

## 2021-06-11 ENCOUNTER — Other Ambulatory Visit: Payer: Self-pay | Admitting: Oncology

## 2021-06-11 ENCOUNTER — Inpatient Hospital Stay: Payer: Medicare HMO | Attending: Oncology | Admitting: Oncology

## 2021-06-11 VITALS — BP 122/59 | HR 66 | Temp 97.4°F | Resp 18 | Ht 65.0 in | Wt 140.3 lb

## 2021-06-11 DIAGNOSIS — C50112 Malignant neoplasm of central portion of left female breast: Secondary | ICD-10-CM

## 2021-06-11 DIAGNOSIS — M8589 Other specified disorders of bone density and structure, multiple sites: Secondary | ICD-10-CM

## 2021-06-11 DIAGNOSIS — Z170 Estrogen receptor positive status [ER+]: Secondary | ICD-10-CM

## 2021-06-11 DIAGNOSIS — R490 Dysphonia: Secondary | ICD-10-CM

## 2021-06-11 NOTE — Telephone Encounter (Signed)
Per 10/18 LOS, patient scheduled for 08/28/21 DEXA - 12/04/21 Mammo (gave P Orders/Instructions).  Scheduled Follow Up w/Dr Hinton Rao 12/10/21. Gave patient Appt Summary

## 2021-06-17 ENCOUNTER — Encounter: Payer: Self-pay | Admitting: Oncology

## 2021-06-17 ENCOUNTER — Other Ambulatory Visit: Payer: Self-pay

## 2021-06-17 ENCOUNTER — Telehealth: Payer: Self-pay | Admitting: Oncology

## 2021-06-17 ENCOUNTER — Other Ambulatory Visit: Payer: Self-pay | Admitting: Oncology

## 2021-06-17 ENCOUNTER — Inpatient Hospital Stay: Payer: Medicare HMO

## 2021-06-17 DIAGNOSIS — C50112 Malignant neoplasm of central portion of left female breast: Secondary | ICD-10-CM

## 2021-06-17 DIAGNOSIS — Z170 Estrogen receptor positive status [ER+]: Secondary | ICD-10-CM

## 2021-06-17 DIAGNOSIS — D510 Vitamin B12 deficiency anemia due to intrinsic factor deficiency: Secondary | ICD-10-CM | POA: Diagnosis not present

## 2021-06-17 DIAGNOSIS — D649 Anemia, unspecified: Secondary | ICD-10-CM | POA: Diagnosis not present

## 2021-06-17 NOTE — Telephone Encounter (Signed)
Patient scheduled for 10/31 CT Neck - Labs

## 2021-06-24 DIAGNOSIS — R490 Dysphonia: Secondary | ICD-10-CM | POA: Diagnosis not present

## 2021-06-24 DIAGNOSIS — R220 Localized swelling, mass and lump, head: Secondary | ICD-10-CM | POA: Diagnosis not present

## 2021-06-24 DIAGNOSIS — E041 Nontoxic single thyroid nodule: Secondary | ICD-10-CM | POA: Diagnosis not present

## 2021-06-27 ENCOUNTER — Telehealth: Payer: Self-pay

## 2021-06-27 ENCOUNTER — Encounter: Payer: Self-pay | Admitting: Oncology

## 2021-06-27 NOTE — Telephone Encounter (Addendum)
Pt notified of below. She verbalized understanding and didn't want to be referred.  ----- Message from Derwood Kaplan, MD sent at 06/27/2021  9:54 AM EDT ----- Regarding: call Tell her the CT scan is all clear.  If she is still having problems, you can refer to ENT for hoarseness and globus sensation

## 2021-07-17 DIAGNOSIS — D510 Vitamin B12 deficiency anemia due to intrinsic factor deficiency: Secondary | ICD-10-CM | POA: Diagnosis not present

## 2021-07-21 DIAGNOSIS — R0902 Hypoxemia: Secondary | ICD-10-CM | POA: Diagnosis not present

## 2021-07-21 DIAGNOSIS — R531 Weakness: Secondary | ICD-10-CM | POA: Diagnosis not present

## 2021-07-21 DIAGNOSIS — R911 Solitary pulmonary nodule: Secondary | ICD-10-CM | POA: Diagnosis not present

## 2021-07-21 DIAGNOSIS — J14 Pneumonia due to Hemophilus influenzae: Secondary | ICD-10-CM | POA: Diagnosis not present

## 2021-07-21 DIAGNOSIS — M25519 Pain in unspecified shoulder: Secondary | ICD-10-CM | POA: Diagnosis not present

## 2021-07-21 DIAGNOSIS — M791 Myalgia, unspecified site: Secondary | ICD-10-CM | POA: Diagnosis not present

## 2021-07-21 DIAGNOSIS — J9601 Acute respiratory failure with hypoxia: Secondary | ICD-10-CM | POA: Diagnosis not present

## 2021-07-21 DIAGNOSIS — J479 Bronchiectasis, uncomplicated: Secondary | ICD-10-CM | POA: Diagnosis not present

## 2021-07-21 DIAGNOSIS — R6883 Chills (without fever): Secondary | ICD-10-CM | POA: Diagnosis not present

## 2021-07-21 DIAGNOSIS — J9621 Acute and chronic respiratory failure with hypoxia: Secondary | ICD-10-CM | POA: Diagnosis not present

## 2021-07-21 DIAGNOSIS — R0602 Shortness of breath: Secondary | ICD-10-CM | POA: Diagnosis not present

## 2021-07-21 DIAGNOSIS — N179 Acute kidney failure, unspecified: Secondary | ICD-10-CM | POA: Diagnosis not present

## 2021-07-21 DIAGNOSIS — K449 Diaphragmatic hernia without obstruction or gangrene: Secondary | ICD-10-CM | POA: Diagnosis not present

## 2021-07-21 DIAGNOSIS — I700 Atherosclerosis of aorta: Secondary | ICD-10-CM | POA: Diagnosis not present

## 2021-07-21 DIAGNOSIS — N183 Chronic kidney disease, stage 3 unspecified: Secondary | ICD-10-CM | POA: Diagnosis not present

## 2021-07-21 DIAGNOSIS — M549 Dorsalgia, unspecified: Secondary | ICD-10-CM | POA: Diagnosis not present

## 2021-07-21 DIAGNOSIS — R5381 Other malaise: Secondary | ICD-10-CM | POA: Diagnosis not present

## 2021-07-21 DIAGNOSIS — E860 Dehydration: Secondary | ICD-10-CM | POA: Diagnosis not present

## 2021-07-21 DIAGNOSIS — Z20828 Contact with and (suspected) exposure to other viral communicable diseases: Secondary | ICD-10-CM | POA: Diagnosis not present

## 2021-07-21 DIAGNOSIS — D72825 Bandemia: Secondary | ICD-10-CM | POA: Diagnosis not present

## 2021-07-21 DIAGNOSIS — I959 Hypotension, unspecified: Secondary | ICD-10-CM | POA: Diagnosis not present

## 2021-07-21 DIAGNOSIS — K5730 Diverticulosis of large intestine without perforation or abscess without bleeding: Secondary | ICD-10-CM | POA: Diagnosis not present

## 2021-07-21 DIAGNOSIS — N178 Other acute kidney failure: Secondary | ICD-10-CM | POA: Diagnosis not present

## 2021-07-21 DIAGNOSIS — J984 Other disorders of lung: Secondary | ICD-10-CM | POA: Diagnosis not present

## 2021-07-21 DIAGNOSIS — I2510 Atherosclerotic heart disease of native coronary artery without angina pectoris: Secondary | ICD-10-CM | POA: Diagnosis not present

## 2021-07-21 DIAGNOSIS — J189 Pneumonia, unspecified organism: Secondary | ICD-10-CM | POA: Diagnosis not present

## 2021-07-21 DIAGNOSIS — R059 Cough, unspecified: Secondary | ICD-10-CM | POA: Diagnosis not present

## 2021-07-21 DIAGNOSIS — E7800 Pure hypercholesterolemia, unspecified: Secondary | ICD-10-CM | POA: Diagnosis not present

## 2021-08-02 DIAGNOSIS — N189 Chronic kidney disease, unspecified: Secondary | ICD-10-CM | POA: Diagnosis not present

## 2021-08-02 DIAGNOSIS — I10 Essential (primary) hypertension: Secondary | ICD-10-CM | POA: Diagnosis not present

## 2021-08-02 DIAGNOSIS — J449 Chronic obstructive pulmonary disease, unspecified: Secondary | ICD-10-CM | POA: Diagnosis not present

## 2021-08-02 DIAGNOSIS — Z6822 Body mass index (BMI) 22.0-22.9, adult: Secondary | ICD-10-CM | POA: Diagnosis not present

## 2021-08-13 DIAGNOSIS — R059 Cough, unspecified: Secondary | ICD-10-CM | POA: Diagnosis not present

## 2021-08-13 DIAGNOSIS — R509 Fever, unspecified: Secondary | ICD-10-CM | POA: Diagnosis not present

## 2021-08-13 DIAGNOSIS — Z20828 Contact with and (suspected) exposure to other viral communicable diseases: Secondary | ICD-10-CM | POA: Diagnosis not present

## 2021-08-13 DIAGNOSIS — R0981 Nasal congestion: Secondary | ICD-10-CM | POA: Diagnosis not present

## 2021-08-15 DIAGNOSIS — S62617A Displaced fracture of proximal phalanx of left little finger, initial encounter for closed fracture: Secondary | ICD-10-CM | POA: Diagnosis not present

## 2021-08-15 DIAGNOSIS — M7989 Other specified soft tissue disorders: Secondary | ICD-10-CM | POA: Diagnosis not present

## 2021-08-15 DIAGNOSIS — M50323 Other cervical disc degeneration at C6-C7 level: Secondary | ICD-10-CM | POA: Diagnosis not present

## 2021-08-15 DIAGNOSIS — R531 Weakness: Secondary | ICD-10-CM | POA: Diagnosis not present

## 2021-08-15 DIAGNOSIS — M545 Low back pain, unspecified: Secondary | ICD-10-CM | POA: Diagnosis not present

## 2021-08-15 DIAGNOSIS — M79601 Pain in right arm: Secondary | ICD-10-CM | POA: Diagnosis not present

## 2021-08-15 DIAGNOSIS — S39012A Strain of muscle, fascia and tendon of lower back, initial encounter: Secondary | ICD-10-CM | POA: Diagnosis not present

## 2021-08-15 DIAGNOSIS — M79642 Pain in left hand: Secondary | ICD-10-CM | POA: Diagnosis not present

## 2021-08-15 DIAGNOSIS — R0902 Hypoxemia: Secondary | ICD-10-CM | POA: Diagnosis not present

## 2021-08-15 DIAGNOSIS — M2578 Osteophyte, vertebrae: Secondary | ICD-10-CM | POA: Diagnosis not present

## 2021-08-16 DIAGNOSIS — M2578 Osteophyte, vertebrae: Secondary | ICD-10-CM | POA: Diagnosis not present

## 2021-08-16 DIAGNOSIS — M50323 Other cervical disc degeneration at C6-C7 level: Secondary | ICD-10-CM | POA: Diagnosis not present

## 2021-08-16 DIAGNOSIS — M7989 Other specified soft tissue disorders: Secondary | ICD-10-CM | POA: Diagnosis not present

## 2021-08-16 DIAGNOSIS — R0902 Hypoxemia: Secondary | ICD-10-CM | POA: Diagnosis not present

## 2021-08-16 DIAGNOSIS — S62617A Displaced fracture of proximal phalanx of left little finger, initial encounter for closed fracture: Secondary | ICD-10-CM | POA: Diagnosis not present

## 2021-08-18 ENCOUNTER — Other Ambulatory Visit: Payer: Self-pay | Admitting: Oncology

## 2021-08-20 DIAGNOSIS — S62647A Nondisplaced fracture of proximal phalanx of left little finger, initial encounter for closed fracture: Secondary | ICD-10-CM | POA: Diagnosis not present

## 2021-08-21 DIAGNOSIS — J449 Chronic obstructive pulmonary disease, unspecified: Secondary | ICD-10-CM | POA: Diagnosis not present

## 2021-08-21 DIAGNOSIS — R0602 Shortness of breath: Secondary | ICD-10-CM | POA: Diagnosis not present

## 2021-08-28 DIAGNOSIS — M8589 Other specified disorders of bone density and structure, multiple sites: Secondary | ICD-10-CM | POA: Diagnosis not present

## 2021-08-28 DIAGNOSIS — M85852 Other specified disorders of bone density and structure, left thigh: Secondary | ICD-10-CM | POA: Diagnosis not present

## 2021-09-03 DIAGNOSIS — S62647A Nondisplaced fracture of proximal phalanx of left little finger, initial encounter for closed fracture: Secondary | ICD-10-CM | POA: Diagnosis not present

## 2021-09-04 ENCOUNTER — Encounter: Payer: Self-pay | Admitting: Oncology

## 2021-09-13 ENCOUNTER — Telehealth: Payer: Self-pay

## 2021-09-13 NOTE — Telephone Encounter (Signed)
-----   Message from Derwood Kaplan, MD sent at 09/04/2021  6:53 PM EST ----- Regarding: call Tell her the bone density scan is a little worse than 2020 but still only osteopenia of the hip and normal of the wrist.  I rec she continue calcium and vitamin D.  Can discuss further at next appt.

## 2021-09-24 DIAGNOSIS — E782 Mixed hyperlipidemia: Secondary | ICD-10-CM | POA: Diagnosis not present

## 2021-09-24 DIAGNOSIS — D510 Vitamin B12 deficiency anemia due to intrinsic factor deficiency: Secondary | ICD-10-CM | POA: Diagnosis not present

## 2021-09-24 DIAGNOSIS — I10 Essential (primary) hypertension: Secondary | ICD-10-CM | POA: Diagnosis not present

## 2021-10-01 DIAGNOSIS — S62647A Nondisplaced fracture of proximal phalanx of left little finger, initial encounter for closed fracture: Secondary | ICD-10-CM | POA: Diagnosis not present

## 2021-10-18 DIAGNOSIS — D510 Vitamin B12 deficiency anemia due to intrinsic factor deficiency: Secondary | ICD-10-CM | POA: Diagnosis not present

## 2021-10-22 DIAGNOSIS — E782 Mixed hyperlipidemia: Secondary | ICD-10-CM | POA: Diagnosis not present

## 2021-10-22 DIAGNOSIS — I10 Essential (primary) hypertension: Secondary | ICD-10-CM | POA: Diagnosis not present

## 2021-11-22 DIAGNOSIS — D519 Vitamin B12 deficiency anemia, unspecified: Secondary | ICD-10-CM | POA: Diagnosis not present

## 2021-12-04 DIAGNOSIS — Z1231 Encounter for screening mammogram for malignant neoplasm of breast: Secondary | ICD-10-CM | POA: Diagnosis not present

## 2021-12-04 NOTE — Progress Notes (Signed)
?[facility]  ?[address] ?[place],  [place]  [address] ?[phone] ? ?Clinic Day:  12/10/21 ? ?Referring physician: Angelina Sheriff, MD ? ?CHIEF COMPLAINT:  ?CC:   Stage I A hormone receptor positive breast cancer ? ?Current Treatment:   Anastrozole 1 mg daily ? ? ?HISTORY OF PRESENT ILLNESS:  ?Nichole Gray is a 81 y.o. female with stage IA (T1a N0 M0) hormone receptor positive left breast cancer diagnosed in February 2020.  This was found on screening mammogram, which revealed a possible distortion in the left breast.  It was noted that her breasts were extremely dense.  Left diagnostic mammogram revealed possible persistent architectural distortion in the outer left breast at the middle depth, as well as a possible subtle architectural distortion in the retroareolar area.  Ultrasound of the left breast and axilla was negative.  Stereotactic biopsy revealed an invasive ductal carcinoma grade 1-2 as well as fibrocystic changes.  She was treated with lumpectomy in March 2020.  Final pathology revealed a 5 mm, grade 1, invasive ductal carcinoma.  Margins were clear.  Axilla was clinically negative.  Estrogen and progesterone receptors were positive and HER 2 negative.  Ki 67 was 5%.  There were also proliferative fibrocystic changes and pseudoangiomatous stromal hyperplasia.  In view of the coronavirus pandemic, Dr. Lilia Pro placed her on anastrozole 1 mg daily in April.  We began seeing her in May, at which time she was tolerating anastrozole without significant difficulty.  Bone density scan in May revealed osteopenia with a T-score of -1.3 in the right femur and a T-score of -0.4 in the left forearm, for which she is taking calcium and vitamin-D.  She had abdominal pain and saw Dr. Lyda Jester. CT abdomen and pelvis in March 2021 did not reveal any acute abnormality or other explanation for her pain.  The abdominal pain later resolved.  Bronchiectasis was seen in  the bilateral lungs.  She sees Dr. Verdie Mosher in pulmonology.  She felt a globus sensation in her throat and this was evaluated by ENT and CT scan, and all was negative. ? ?INTERVAL HISTORY:  ?[name] is here for routine follow up and states that she has been fairly well.  She continues anastrozole daily without significant difficulty. Bilateral diagnostic mammogram from April 2023 did not reveal any evidence of malignancy.  Her bone density does show some worsening but still only osteopenia.  The T score of her left femur is now -1.7, previously -1.1.  The total mean is down to -1.3, previously -0.5.  The forearm is normal.  She is taking vitamin D daily and I recommended that she also take calcium, which she has not been doing. Her  appetite is good, and she has gained 4 pounds since her last visit.  She denies fever, chills or other signs of infection.  She denies nausea, vomiting and bowel issues.  She denies sore throat, cough, dyspnea, or chest pain. ? ?REVIEW OF SYSTEMS:  ?Review of Systems  ?Constitutional: Negative.  Negative for appetite change, chills, fatigue, fever and unexpected weight change.  ?HENT:   Voice change: hoarseness.   ?     Sensation of a lump in her throat  ?Eyes: Negative.   ?Respiratory: Negative.  Negative for chest tightness, cough, hemoptysis, shortness of breath and wheezing.   ?Cardiovascular: Negative.  Negative for chest pain, leg swelling and palpitations.  ?Gastrointestinal:  Negative for abdominal distention, abdominal pain (intermittent of the left lower quadrant), blood  in stool, constipation, diarrhea, nausea and vomiting.  ?Endocrine: Negative.   ?Genitourinary: Negative.  Negative for difficulty urinating, dysuria, frequency and hematuria.   ?Musculoskeletal: Negative.  Negative for arthralgias, back pain, flank pain, gait problem and myalgias.  ?Skin: Negative.   ?Neurological: Negative.  Negative for dizziness, extremity weakness, gait problem, headaches, light-headedness,  numbness, seizures and speech difficulty.  ?Hematological: Negative.   ?Psychiatric/Behavioral: Negative.  Negative for depression and sleep disturbance. The patient is not nervous/anxious.    ? ?VITALS:  ?Blood pressure 135/64, pulse 83, temperature 97.6 ?F (36.4 ?C), temperature source Oral, resp. rate 20, height 5' 5.2" (1.656 m), weight 139 lb 14.4 oz (63.5 kg), SpO2 95 %.  ?Wt Readings from Last 3 Encounters:  ?12/10/21 139 lb 14.4 oz (63.5 kg)  ?06/11/21 140 lb 4.8 oz (63.6 kg)  ?12/10/20 135 lb 11.2 oz (61.6 kg)  ?  ?Body mass index is 23.14 kg/m?. ? ?Performance status (ECOG): 1 - Symptomatic but completely ambulatory ? ?PHYSICAL EXAM:  ?Physical Exam ?Constitutional:   ?   General: She is not in acute distress. ?   Appearance: Normal appearance. She is normal weight.  ?HENT:  ?   Head: Normocephalic and atraumatic.  ?Eyes:  ?   General: No scleral icterus. ?   Extraocular Movements: Extraocular movements intact.  ?   Conjunctiva/sclera: Conjunctivae normal.  ?   Pupils: Pupils are equal, round, and reactive to light.  ?Cardiovascular:  ?   Rate and Rhythm: Normal rate and regular rhythm.  ?   Pulses: Normal pulses.  ?   Heart sounds: Normal heart sounds. No murmur heard. ?  No friction rub. No gallop.  ?Pulmonary:  ?   Effort: Pulmonary effort is normal. No respiratory distress.  ?   Breath sounds: Normal breath sounds.  ?Chest:  ?Breasts: ?   Right: Normal.  ?   Left: Normal.  ?   Comments: Well healed faint scar in the lateral left breast. Both breasts are without masses. ?Abdominal:  ?   General: Bowel sounds are normal. There is no distension.  ?   Palpations: Abdomen is soft. There is no hepatomegaly, splenomegaly or mass.  ?   Tenderness: There is no abdominal tenderness.  ?Musculoskeletal:     ?   General: Normal range of motion.  ?   Cervical back: Normal range of motion and neck supple.  ?   Right lower leg: No edema.  ?   Left lower leg: No edema.  ?Lymphadenopathy:  ?   Cervical: No cervical  adenopathy.  ?Skin: ?   General: Skin is warm and dry.  ?Neurological:  ?   General: No focal deficit present.  ?   Mental Status: She is alert and oriented to person, place, and time. Mental status is at baseline.  ?Psychiatric:     ?   Mood and Affect: Mood normal.     ?   Behavior: Behavior normal.     ?   Thought Content: Thought content normal.     ?   Judgment: Judgment normal.  ? ?LABS:  ? ?   ? View : No data to display.  ?  ?  ?  ? ?   ? View : No data to display.  ?  ?  ?  ? ? ? ?STUDIES:  ?See EPIC files ? ?HISTORY:  ? ?Allergies:  ?Allergies  ?Allergen Reactions  ? Codeine Nausea And Vomiting and Rash  ?  Nausea, vomiting ?Nausea, vomiting ?  ?  Penicillins Rash  ?  Rash, itch ?Other reaction(s): Other ?Pt doesn't remember reaction because it was long ago ?Rash, itch ?  ? Hydrocodone Rash  ?  vomting ?vomting  ? Prednisone Rash  ?  Can tolerate medication but doesn't like taking it ?Can tolerate medication but doesn't like taking it  ? ? ?Current Medications: ?Current Outpatient Medications  ?Medication Sig Dispense Refill  ? allopurinol (ZYLOPRIM) 100 MG tablet Take 100 mg by mouth 2 (two) times daily.    ? amLODipine (NORVASC) 10 MG tablet Take 10 mg by mouth daily.    ? anastrozole (ARIMIDEX) 1 MG tablet TAKE 1 TABLET EVERY DAY 90 tablet 3  ? BREZTRI AEROSPHERE 160-9-4.8 MCG/ACT AERO Inhale into the lungs.    ? Cyanocobalamin (VITAMIN B-12 IJ) Inject 1,000 mcg as directed every 30 (thirty) days.    ? fluticasone (FLONASE) 50 MCG/ACT nasal spray Place 2 sprays into both nostrils daily.    ? ipratropium-albuterol (DUONEB) 0.5-2.5 (3) MG/3ML SOLN Take by nebulization.    ? losartan (COZAAR) 100 MG tablet Take 100 mg by mouth daily.    ? omeprazole (PRILOSEC) 20 MG capsule Take 20 mg by mouth 2 (two) times daily.     ? Vitamin D, Ergocalciferol, (DRISDOL) 50000 UNITS CAPS capsule Take 1 capsule by mouth. Twice weekly    ? gabapentin (NEURONTIN) 300 MG capsule Take 600 mg by mouth. Take 2 tablet at bedtime.     ? levofloxacin (LEVAQUIN) 500 MG tablet Take 500 mg by mouth daily. (Patient not taking: Reported on 12/10/2021)    ? methocarbamol (ROBAXIN) 500 MG tablet Take by mouth. (Patient not taking: Reported

## 2021-12-05 ENCOUNTER — Encounter: Payer: Self-pay | Admitting: Oncology

## 2021-12-10 ENCOUNTER — Other Ambulatory Visit: Payer: Self-pay | Admitting: Oncology

## 2021-12-10 ENCOUNTER — Inpatient Hospital Stay: Attending: Oncology | Admitting: Oncology

## 2021-12-10 VITALS — BP 135/64 | HR 83 | Temp 97.6°F | Resp 20 | Ht 65.2 in | Wt 139.9 lb

## 2021-12-10 DIAGNOSIS — C50112 Malignant neoplasm of central portion of left female breast: Secondary | ICD-10-CM | POA: Diagnosis not present

## 2021-12-10 DIAGNOSIS — Z170 Estrogen receptor positive status [ER+]: Secondary | ICD-10-CM | POA: Diagnosis not present

## 2021-12-20 DIAGNOSIS — R509 Fever, unspecified: Secondary | ICD-10-CM | POA: Diagnosis not present

## 2021-12-20 DIAGNOSIS — J449 Chronic obstructive pulmonary disease, unspecified: Secondary | ICD-10-CM | POA: Diagnosis not present

## 2021-12-20 DIAGNOSIS — R0602 Shortness of breath: Secondary | ICD-10-CM | POA: Diagnosis not present

## 2021-12-20 DIAGNOSIS — R059 Cough, unspecified: Secondary | ICD-10-CM | POA: Diagnosis not present

## 2022-01-03 DIAGNOSIS — J449 Chronic obstructive pulmonary disease, unspecified: Secondary | ICD-10-CM | POA: Diagnosis not present

## 2022-01-09 DIAGNOSIS — D510 Vitamin B12 deficiency anemia due to intrinsic factor deficiency: Secondary | ICD-10-CM | POA: Diagnosis not present

## 2022-02-13 DIAGNOSIS — D510 Vitamin B12 deficiency anemia due to intrinsic factor deficiency: Secondary | ICD-10-CM | POA: Diagnosis not present

## 2022-03-11 DIAGNOSIS — Z0000 Encounter for general adult medical examination without abnormal findings: Secondary | ICD-10-CM | POA: Diagnosis not present

## 2022-03-11 DIAGNOSIS — M109 Gout, unspecified: Secondary | ICD-10-CM | POA: Diagnosis not present

## 2022-03-11 DIAGNOSIS — I10 Essential (primary) hypertension: Secondary | ICD-10-CM | POA: Diagnosis not present

## 2022-03-11 DIAGNOSIS — E559 Vitamin D deficiency, unspecified: Secondary | ICD-10-CM | POA: Diagnosis not present

## 2022-03-11 DIAGNOSIS — Z79899 Other long term (current) drug therapy: Secondary | ICD-10-CM | POA: Diagnosis not present

## 2022-03-11 DIAGNOSIS — Z1331 Encounter for screening for depression: Secondary | ICD-10-CM | POA: Diagnosis not present

## 2022-03-11 DIAGNOSIS — E7800 Pure hypercholesterolemia, unspecified: Secondary | ICD-10-CM | POA: Diagnosis not present

## 2022-03-11 DIAGNOSIS — Z6823 Body mass index (BMI) 23.0-23.9, adult: Secondary | ICD-10-CM | POA: Diagnosis not present

## 2022-03-11 DIAGNOSIS — G479 Sleep disorder, unspecified: Secondary | ICD-10-CM | POA: Diagnosis not present

## 2022-03-20 DIAGNOSIS — D510 Vitamin B12 deficiency anemia due to intrinsic factor deficiency: Secondary | ICD-10-CM | POA: Diagnosis not present

## 2022-04-02 DIAGNOSIS — J441 Chronic obstructive pulmonary disease with (acute) exacerbation: Secondary | ICD-10-CM | POA: Diagnosis not present

## 2022-04-02 DIAGNOSIS — R062 Wheezing: Secondary | ICD-10-CM | POA: Diagnosis not present

## 2022-04-22 DIAGNOSIS — I10 Essential (primary) hypertension: Secondary | ICD-10-CM | POA: Diagnosis not present

## 2022-04-22 DIAGNOSIS — J209 Acute bronchitis, unspecified: Secondary | ICD-10-CM | POA: Diagnosis not present

## 2022-04-24 DIAGNOSIS — E538 Deficiency of other specified B group vitamins: Secondary | ICD-10-CM | POA: Diagnosis not present

## 2022-06-02 DIAGNOSIS — Z6823 Body mass index (BMI) 23.0-23.9, adult: Secondary | ICD-10-CM | POA: Diagnosis not present

## 2022-06-02 DIAGNOSIS — A310 Pulmonary mycobacterial infection: Secondary | ICD-10-CM | POA: Diagnosis not present

## 2022-06-02 DIAGNOSIS — J449 Chronic obstructive pulmonary disease, unspecified: Secondary | ICD-10-CM | POA: Diagnosis not present

## 2022-06-02 DIAGNOSIS — J441 Chronic obstructive pulmonary disease with (acute) exacerbation: Secondary | ICD-10-CM | POA: Diagnosis not present

## 2022-06-10 DIAGNOSIS — N189 Chronic kidney disease, unspecified: Secondary | ICD-10-CM | POA: Diagnosis not present

## 2022-06-10 DIAGNOSIS — E538 Deficiency of other specified B group vitamins: Secondary | ICD-10-CM | POA: Diagnosis not present

## 2022-06-10 DIAGNOSIS — J441 Chronic obstructive pulmonary disease with (acute) exacerbation: Secondary | ICD-10-CM | POA: Diagnosis not present

## 2022-06-10 DIAGNOSIS — Z6822 Body mass index (BMI) 22.0-22.9, adult: Secondary | ICD-10-CM | POA: Diagnosis not present

## 2022-07-07 DIAGNOSIS — I10 Essential (primary) hypertension: Secondary | ICD-10-CM | POA: Diagnosis not present

## 2022-07-07 DIAGNOSIS — Z23 Encounter for immunization: Secondary | ICD-10-CM | POA: Diagnosis not present

## 2022-07-07 DIAGNOSIS — E7800 Pure hypercholesterolemia, unspecified: Secondary | ICD-10-CM | POA: Diagnosis not present

## 2022-07-07 DIAGNOSIS — J449 Chronic obstructive pulmonary disease, unspecified: Secondary | ICD-10-CM | POA: Diagnosis not present

## 2022-07-22 DIAGNOSIS — D519 Vitamin B12 deficiency anemia, unspecified: Secondary | ICD-10-CM | POA: Diagnosis not present

## 2022-08-20 ENCOUNTER — Other Ambulatory Visit: Payer: Self-pay | Admitting: Oncology

## 2022-08-21 DIAGNOSIS — D510 Vitamin B12 deficiency anemia due to intrinsic factor deficiency: Secondary | ICD-10-CM | POA: Diagnosis not present

## 2022-09-11 DIAGNOSIS — Z01419 Encounter for gynecological examination (general) (routine) without abnormal findings: Secondary | ICD-10-CM | POA: Diagnosis not present

## 2022-09-11 DIAGNOSIS — Z6822 Body mass index (BMI) 22.0-22.9, adult: Secondary | ICD-10-CM | POA: Diagnosis not present

## 2022-09-24 DIAGNOSIS — E538 Deficiency of other specified B group vitamins: Secondary | ICD-10-CM | POA: Diagnosis not present

## 2022-10-22 DIAGNOSIS — D519 Vitamin B12 deficiency anemia, unspecified: Secondary | ICD-10-CM | POA: Diagnosis not present

## 2022-11-13 DIAGNOSIS — S4991XA Unspecified injury of right shoulder and upper arm, initial encounter: Secondary | ICD-10-CM | POA: Diagnosis not present

## 2022-11-13 DIAGNOSIS — M25511 Pain in right shoulder: Secondary | ICD-10-CM | POA: Diagnosis not present

## 2022-11-13 DIAGNOSIS — M25521 Pain in right elbow: Secondary | ICD-10-CM | POA: Diagnosis not present

## 2022-11-13 DIAGNOSIS — W19XXXA Unspecified fall, initial encounter: Secondary | ICD-10-CM | POA: Diagnosis not present

## 2022-11-18 DIAGNOSIS — S52121A Displaced fracture of head of right radius, initial encounter for closed fracture: Secondary | ICD-10-CM | POA: Diagnosis not present

## 2022-11-28 DIAGNOSIS — J479 Bronchiectasis, uncomplicated: Secondary | ICD-10-CM | POA: Diagnosis not present

## 2022-11-28 DIAGNOSIS — N183 Chronic kidney disease, stage 3 unspecified: Secondary | ICD-10-CM | POA: Diagnosis not present

## 2022-11-28 DIAGNOSIS — Z170 Estrogen receptor positive status [ER+]: Secondary | ICD-10-CM | POA: Diagnosis not present

## 2022-11-28 DIAGNOSIS — J42 Unspecified chronic bronchitis: Secondary | ICD-10-CM | POA: Diagnosis not present

## 2022-11-28 DIAGNOSIS — R0683 Snoring: Secondary | ICD-10-CM | POA: Diagnosis not present

## 2022-11-28 DIAGNOSIS — C50912 Malignant neoplasm of unspecified site of left female breast: Secondary | ICD-10-CM | POA: Diagnosis not present

## 2022-11-28 DIAGNOSIS — Z87891 Personal history of nicotine dependence: Secondary | ICD-10-CM | POA: Diagnosis not present

## 2022-11-28 DIAGNOSIS — R0681 Apnea, not elsewhere classified: Secondary | ICD-10-CM | POA: Diagnosis not present

## 2022-11-28 DIAGNOSIS — R053 Chronic cough: Secondary | ICD-10-CM | POA: Diagnosis not present

## 2022-12-04 DIAGNOSIS — J849 Interstitial pulmonary disease, unspecified: Secondary | ICD-10-CM | POA: Diagnosis not present

## 2022-12-04 DIAGNOSIS — R053 Chronic cough: Secondary | ICD-10-CM | POA: Diagnosis not present

## 2022-12-04 DIAGNOSIS — J479 Bronchiectasis, uncomplicated: Secondary | ICD-10-CM | POA: Diagnosis not present

## 2022-12-09 NOTE — Progress Notes (Signed)
[facility]  [address] [place],  [place]  [address] [phone]  Clinic Day:  12/12/2022  Referring physician: Noni Saupe, MD   CHIEF COMPLAINT:  CC: Stage IA hormone receptor positive breast cancer  Current Treatment: Anastrozole 1 mg daily  HISTORY OF PRESENT ILLNESS:  Nichole Gray is a 82 y.o. female with a history stage IA (T1a N0 M0) hormone receptor positive left breast cancer diagnosed in February 2020.  This was found on screening mammogram, which revealed a possible distortion in the left breast.  It was noted that her breasts were extremely dense.  Left diagnostic mammogram revealed possible persistent architectural distortion in the outer left breast at the middle depth, as well as a possible subtle architectural distortion in the retroareolar area.  Ultrasound of the left breast and axilla was negative.  Stereotactic biopsy revealed an invasive ductal carcinoma grade 1-2 as well as fibrocystic changes.  She was treated with lumpectomy in March 2020.  Final pathology revealed a 5 mm, grade 1, invasive ductal carcinoma.  Margins were clear.  Axilla was clinically negative.  Estrogen and progesterone receptors were positive and HER 2 negative.  Ki 67 was 5%.  There were also proliferative fibrocystic changes and pseudoangiomatous stromal hyperplasia.  In view of the coronavirus pandemic, Dr. Lequita Halt placed her on anastrozole 1 mg daily in April.   We began seeing her in May, at which time she was tolerating anastrozole without significant difficulty.  Bone density scan in May 2020 revealed osteopenia with a T-score of -1.3 in the right femur and a T-score of -0.4 in the left forearm, for which she is taking calcium and vitamin-D.  She had abdominal pain and saw Dr. Jennye Boroughs. CT abdomen and pelvis in March 2021 did not reveal any acute abnormality or other explanation for her pain.  She did not have continued abdominal pain.   Bronchiectasis was seen in the bilateral lungs.  She sees Dr. Eulis Foster in pulmonology.  She feels a globus sensation in her throat and was evaluated by ENT with apparent normal findings.  CT neck in October 2022 did not reveal an etiology.  Bone density scan in January 2023 revealed mild worsening of her osteopenia with a T-score of -1.7 in the left femur neck and a T-score of -1.3 in the total femur.  Left forearm density was normal.  Annual mammograms have not revealed any evidence of malignancy.  Oncology History  Malignant neoplasm of central portion of left female breast  11/01/2018 Initial Diagnosis   Breast cancer in female [facility])   11/01/2018 Cancer Staging   Staging form: Breast, AJCC 8th Edition - Clinical stage from 11/01/2018: Stage IA (cT1a, cN0, cM0, G1, ER+, PR+, HER2-) - Signed by Dellia Beckwith, MD on 10/21/2020 Histopathologic type: Infiltrating duct carcinoma, NOS Stage prefix: Initial diagnosis Method of lymph node assessment: Clinical Nuclear grade: G1 Multigene prognostic tests performed: None Menopausal status: Postmenopausal Ki-67 (%): 5 Stage used in treatment planning: Yes National guidelines used in treatment planning: Yes Type of national guideline used in treatment planning: NCCN Staging comments: Central left breast       INTERVAL HISTORY:  Nichole Gray is here today for repeat clinical assessment.  She states she continues anastrozole daily without significant difficulty.  She denies any changes in her breasts.  She has intermittent joint pain and upper abdominal pain.  She fell in March and fractured her right radial head.  This is healing well and she  is having occasional moderate pain of the elbow.  She denies fevers or chills.  Her appetite is good. Her weight has increased 3 pounds over last year .  Prior to her visit today, she underwent bilateral screening mammogram.  REVIEW OF SYSTEMS:  Review of Systems  Constitutional:  Negative for appetite change,  chills, fatigue, fever and unexpected weight change.  HENT:   Negative for lump/mass, mouth sores and sore throat.   Respiratory:  Negative for cough and shortness of breath.   Cardiovascular:  Negative for chest pain and leg swelling.  Gastrointestinal:  Negative for abdominal pain, constipation, diarrhea, nausea and vomiting.  Endocrine: Negative for hot flashes.  Genitourinary:  Negative for difficulty urinating, dysuria, frequency and hematuria.   Musculoskeletal:  Positive for arthralgias. Negative for back pain and myalgias.  Skin:  Negative for rash.  Neurological:  Negative for dizziness and headaches.  Hematological:  Negative for adenopathy. Does not bruise/bleed easily.  Psychiatric/Behavioral:  Negative for depression and sleep disturbance. The patient is not nervous/anxious.      VITALS:  Blood pressure 127/77, pulse 67, temperature 97.6 F (36.4 C), temperature source Oral, resp. rate 20, height 5' 5.2" (1.656 m), weight 142 lb 3.2 oz (64.5 kg), SpO2 99 %.  Wt Readings from Last 3 Encounters:  12/12/22 142 lb 3.2 oz (64.5 kg)  12/10/21 139 lb 14.4 oz (63.5 kg)  06/11/21 140 lb 4.8 oz (63.6 kg)    Body mass index is 23.52 kg/m.  Performance status (ECOG): 1 - Symptomatic but completely ambulatory  PHYSICAL EXAM:  Physical Exam Vitals and nursing note reviewed.  Constitutional:      General: She is not in acute distress.    Appearance: Normal appearance.  HENT:     Head: Normocephalic and atraumatic.     Mouth/Throat:     Mouth: Mucous membranes are moist.     Pharynx: Oropharynx is clear. No oropharyngeal exudate or posterior oropharyngeal erythema.  Eyes:     General: No scleral icterus.    Extraocular Movements: Extraocular movements intact.     Conjunctiva/sclera: Conjunctivae normal.     Pupils: Pupils are equal, round, and reactive to light.  Cardiovascular:     Rate and Rhythm: Normal rate and regular rhythm.     Heart sounds: Normal heart sounds. No  murmur heard.    No friction rub. No gallop.  Pulmonary:     Effort: Pulmonary effort is normal.     Breath sounds: Normal breath sounds. No wheezing, rhonchi or rales.  Chest:  Breasts:    Right: Normal. No inverted nipple, mass, nipple discharge or skin change.     Left: Normal. No inverted nipple, mass, nipple discharge or skin change.  Abdominal:     General: There is no distension.     Palpations: Abdomen is soft. There is no hepatomegaly, splenomegaly or mass.     Tenderness: There is no abdominal tenderness.  Musculoskeletal:        General: Normal range of motion.     Cervical back: Normal range of motion and neck supple. No tenderness.     Right lower leg: No edema.     Left lower leg: No edema.  Lymphadenopathy:     Cervical: No cervical adenopathy.     Upper Body:     Right upper body: No supraclavicular or axillary adenopathy.     Left upper body: No supraclavicular or axillary adenopathy.     Lower Body: No right inguinal adenopathy. No  left inguinal adenopathy.  Skin:    General: Skin is warm and dry.     Coloration: Skin is not jaundiced.     Findings: No rash.  Neurological:     Mental Status: She is alert and oriented to person, place, and time.     Cranial Nerves: No cranial nerve deficit.  Psychiatric:        Mood and Affect: Mood normal.        Behavior: Behavior normal.        Thought Content: Thought content normal.     LABS:       No data to display         STUDIES:    Exam(s): [id number] MAM/MAM DIGITAL TOMO SCREENING B  CLINICAL DATA: Screening.  EXAM:  DIGITAL SCREENING BILATERAL MAMMOGRAM WITH TOMOSYNTHESIS AND CAD  TECHNIQUE:  Bilateral screening digital craniocaudal and mediolateral oblique  mammograms were obtained. Bilateral screening digital breast  tomosynthesis was performed. The images were evaluated with  computer-aided detection.  COMPARISON: Previous exam(s).  ACR Breast Density Category d: The breasts are extremely dense,   which lowers the sensitivity of mammography.  FINDINGS:  There are no findings suspicious for malignancy.  IMPRESSION:  No mammographic evidence of malignancy. A result letter of this  screening mammogram will be mailed directly to the patient.  RECOMMENDATION:  Screening mammogram in one year. (Code:SM-B-01Y)  BI-RADS CATEGORY 1: Negative   HISTORY:   Past Medical History:  Diagnosis Date   Asthma    Benign essential hypertension    Elevated cholesterol    Gout    History of pneumonia    Idiopathic neuropathy    Insomnia    Osteopenia    Renal insufficiency    Vitamin D insufficiency     Past Surgical History:  Procedure Laterality Date   APPENDECTOMY     TONSILLECTOMY     VESICOVAGINAL FISTULA CLOSURE W/ TAH      Family History  Problem Relation Age of Onset   Coronary artery disease Mother    Stroke Other    Brain cancer Father    Emphysema Father    Rheum arthritis Sister    Diabetes Other    Heart disease Sister    Breast cancer Sister     Social History:  reports that she quit smoking about 28 years ago. Her smoking use included cigarettes. She has a 5.00 pack-year smoking history. She has never used smokeless tobacco. She reports that she does not drink alcohol and does not use drugs.The patient is alone today.  Allergies:  Allergies  Allergen Reactions   Codeine Nausea And Vomiting and Rash    Nausea, vomiting   Penicillins Rash    Rash, itch  Other reaction(s): Other  Pt doesn't remember reaction because it was long ago   Hydrocodone Rash    vomting   Prednisone Rash    Can tolerate medication but doesn't like taking it    Current Medications: Current Outpatient Medications  Medication Sig Dispense Refill   AREXVY 120 MCG/0.5ML injection      cholecalciferol (VITAMIN D3) 25 MCG (1000 UNIT) tablet Take by mouth.     lansoprazole (PREVACID) 30 MG capsule Take by mouth.     losartan (COZAAR) 50 MG tablet Take 50 mg by mouth daily.      MAGNESIUM PO Take by mouth.     allopurinol (ZYLOPRIM) 100 MG tablet Take 100 mg by mouth 2 (two) times daily.     amLODipine (  NORVASC) 10 MG tablet Take 10 mg by mouth daily.     anastrozole (ARIMIDEX) 1 MG tablet TAKE 1 TABLET EVERY DAY 90 tablet 3   BREZTRI AEROSPHERE 160-9-4.8 MCG/ACT AERO Inhale into the lungs.     Cyanocobalamin (VITAMIN B-12 IJ) Inject 1,000 mcg as directed every 30 (thirty) days.     gabapentin (NEURONTIN) 300 MG capsule Take 600 mg by mouth. Take 2 tablet at bedtime.     ipratropium-albuterol (DUONEB) 0.5-2.5 (3) MG/3ML SOLN Take by nebulization.     Vitamin D, Ergocalciferol, (DRISDOL) 50000 UNITS CAPS capsule Take 1 capsule by mouth. Twice weekly     No current facility-administered medications for this visit.     ASSESSMENT & PLAN:   Assessment & Plan: Nichole Gray is a 82 y.o. female with stage IA hormone receptor positive left breast cancer diagnosed in 2020.  She remains without evidence of recurrence.  She knows to continue anastrozole for 1 more year.  She will be due for bone density scan in January.  We will plan to see her back in 1 year with bilateral diagnostic mammogram.  We will then transition her to long-term survivorship.  The patient understands the plans discussed today and is in agreement with them.  She knows to contact our office if she develops concerns prior to her next appointment.     I provided 20 minutes of face-to-face time during this encounter and > 50% was spent counseling as documented under my assessment and plan.    Adah Perl, PA-C  [facility] [address] [place] [address] Dept: [phone] Dept Fax: [phone]   Orders Placed This Encounter  Procedures   DG Bone Density    Standing Status:   Future    Standing Expiration Date:   12/12/2023    Scheduling Instructions:     RH    Order Specific Question:   Reason for Exam (SYMPTOM   OR DIAGNOSIS REQUIRED)    Answer:   osteopenia    Order Specific Question:   Preferred imaging location?    Answer:   External   MM DIGITAL SCREENING BILATERAL    Standing Status:   Future    Standing Expiration Date:   12/12/2023    Scheduling Instructions:     RH    Order Specific Question:   Reason for exam:    Answer:   screening, h/o breast cancer    Order Specific Question:   Preferred imaging location?    Answer:   External

## 2022-12-10 DIAGNOSIS — N189 Chronic kidney disease, unspecified: Secondary | ICD-10-CM | POA: Diagnosis not present

## 2022-12-10 DIAGNOSIS — E538 Deficiency of other specified B group vitamins: Secondary | ICD-10-CM | POA: Diagnosis not present

## 2022-12-10 DIAGNOSIS — Z1231 Encounter for screening mammogram for malignant neoplasm of breast: Secondary | ICD-10-CM | POA: Diagnosis not present

## 2022-12-10 LAB — HM MAMMOGRAPHY

## 2022-12-12 ENCOUNTER — Ambulatory Visit: Admitting: Oncology

## 2022-12-12 ENCOUNTER — Encounter: Payer: Self-pay | Admitting: Hematology and Oncology

## 2022-12-12 ENCOUNTER — Inpatient Hospital Stay: Payer: Medicare PPO | Attending: Hematology and Oncology | Admitting: Hematology and Oncology

## 2022-12-12 VITALS — BP 127/77 | HR 67 | Temp 97.6°F | Resp 20 | Ht 65.2 in | Wt 142.2 lb

## 2022-12-12 DIAGNOSIS — Z170 Estrogen receptor positive status [ER+]: Secondary | ICD-10-CM

## 2022-12-12 DIAGNOSIS — C50112 Malignant neoplasm of central portion of left female breast: Secondary | ICD-10-CM

## 2022-12-12 DIAGNOSIS — M8589 Other specified disorders of bone density and structure, multiple sites: Secondary | ICD-10-CM

## 2022-12-17 DIAGNOSIS — G4761 Periodic limb movement disorder: Secondary | ICD-10-CM | POA: Diagnosis not present

## 2022-12-17 DIAGNOSIS — G4733 Obstructive sleep apnea (adult) (pediatric): Secondary | ICD-10-CM | POA: Diagnosis not present

## 2022-12-22 DIAGNOSIS — G4733 Obstructive sleep apnea (adult) (pediatric): Secondary | ICD-10-CM | POA: Diagnosis not present

## 2022-12-22 DIAGNOSIS — G4761 Periodic limb movement disorder: Secondary | ICD-10-CM | POA: Diagnosis not present

## 2022-12-26 DIAGNOSIS — R059 Cough, unspecified: Secondary | ICD-10-CM | POA: Diagnosis not present

## 2022-12-26 DIAGNOSIS — J449 Chronic obstructive pulmonary disease, unspecified: Secondary | ICD-10-CM | POA: Diagnosis not present

## 2022-12-26 DIAGNOSIS — J181 Lobar pneumonia, unspecified organism: Secondary | ICD-10-CM | POA: Diagnosis not present

## 2022-12-26 DIAGNOSIS — R0981 Nasal congestion: Secondary | ICD-10-CM | POA: Diagnosis not present

## 2023-01-06 DIAGNOSIS — S52121A Displaced fracture of head of right radius, initial encounter for closed fracture: Secondary | ICD-10-CM | POA: Diagnosis not present

## 2023-01-06 DIAGNOSIS — S52123A Displaced fracture of head of unspecified radius, initial encounter for closed fracture: Secondary | ICD-10-CM | POA: Diagnosis not present

## 2023-05-07 DIAGNOSIS — D519 Vitamin B12 deficiency anemia, unspecified: Secondary | ICD-10-CM | POA: Diagnosis not present

## 2023-05-14 DIAGNOSIS — G4733 Obstructive sleep apnea (adult) (pediatric): Secondary | ICD-10-CM | POA: Diagnosis not present

## 2023-05-19 DIAGNOSIS — I878 Other specified disorders of veins: Secondary | ICD-10-CM | POA: Diagnosis not present

## 2023-05-19 DIAGNOSIS — Z6822 Body mass index (BMI) 22.0-22.9, adult: Secondary | ICD-10-CM | POA: Diagnosis not present

## 2023-06-09 DIAGNOSIS — E538 Deficiency of other specified B group vitamins: Secondary | ICD-10-CM | POA: Diagnosis not present

## 2023-06-10 ENCOUNTER — Other Ambulatory Visit: Payer: Self-pay | Admitting: Oncology

## 2023-06-13 DIAGNOSIS — G4733 Obstructive sleep apnea (adult) (pediatric): Secondary | ICD-10-CM | POA: Diagnosis not present

## 2023-06-15 DIAGNOSIS — G4733 Obstructive sleep apnea (adult) (pediatric): Secondary | ICD-10-CM | POA: Diagnosis not present

## 2023-06-29 DIAGNOSIS — H26492 Other secondary cataract, left eye: Secondary | ICD-10-CM | POA: Diagnosis not present

## 2023-06-29 DIAGNOSIS — H35363 Drusen (degenerative) of macula, bilateral: Secondary | ICD-10-CM | POA: Diagnosis not present

## 2023-06-29 DIAGNOSIS — Z961 Presence of intraocular lens: Secondary | ICD-10-CM | POA: Diagnosis not present

## 2023-06-29 DIAGNOSIS — H43813 Vitreous degeneration, bilateral: Secondary | ICD-10-CM | POA: Diagnosis not present

## 2023-07-13 DIAGNOSIS — D519 Vitamin B12 deficiency anemia, unspecified: Secondary | ICD-10-CM | POA: Diagnosis not present

## 2023-07-14 DIAGNOSIS — G4733 Obstructive sleep apnea (adult) (pediatric): Secondary | ICD-10-CM | POA: Diagnosis not present

## 2023-08-10 DIAGNOSIS — D519 Vitamin B12 deficiency anemia, unspecified: Secondary | ICD-10-CM | POA: Diagnosis not present

## 2023-08-13 DIAGNOSIS — G4733 Obstructive sleep apnea (adult) (pediatric): Secondary | ICD-10-CM | POA: Diagnosis not present

## 2023-12-03 DIAGNOSIS — I340 Nonrheumatic mitral (valve) insufficiency: Secondary | ICD-10-CM | POA: Diagnosis not present

## 2023-12-11 NOTE — Progress Notes (Incomplete)
 [facility]  [address] [place],  [place]  [address] [phone]  Clinic Day:  12/11/2023  Referring physician: Madelyne Schiff, MD  CHIEF COMPLAINT:  CC: Stage IA hormone receptor positive breast cancer  Current Treatment: Anastrozole 1 mg daily  HISTORY OF PRESENT ILLNESS:  Nichole Gray is a 83 y.o. female with a history stage IA (T1a N0 M0) hormone receptor positive left breast cancer diagnosed in February 2020.  This was found on screening mammogram, which revealed a possible distortion in the left breast.  It was noted that her breasts were extremely dense.  Left diagnostic mammogram revealed possible persistent architectural distortion in the outer left breast at the middle depth, as well as a possible subtle architectural distortion in the retroareolar area.  Ultrasound of the left breast and axilla was negative.  Stereotactic biopsy revealed an invasive ductal carcinoma grade 1-2 as well as fibrocystic changes.  She was treated with lumpectomy in March 2020.  Final pathology revealed a 5 mm, grade 1, invasive ductal carcinoma.  Margins were clear.  Axilla was clinically negative.  Estrogen and progesterone receptors were positive and HER 2 negative.  Ki 67 was 5%.  There were also proliferative fibrocystic changes and pseudoangiomatous stromal hyperplasia.  In view of the coronavirus pandemic, Dr. Britta Candy placed her on anastrozole 1 mg daily in April.   We began seeing her in May, at which time she was tolerating anastrozole without significant difficulty.  Bone density scan in May 2020 revealed osteopenia with a T-score of -1.3 in the right femur and a T-score of -0.4 in the left forearm, for which she is taking calcium and vitamin-D.  She had abdominal pain and saw Dr. Monico Anna. CT abdomen and pelvis in March 2021 did not reveal any acute abnormality or other explanation for her pain.  She did not have continued abdominal pain.  Bronchiectasis was seen in the  bilateral lungs.  She sees Dr. Lala Pili in pulmonology.  She feels a globus sensation in her throat and was evaluated by ENT with apparent normal findings.  CT neck in October 2022 did not reveal an etiology.  Bone density scan in January 2023 revealed mild worsening of her osteopenia with a T-score of -1.7 in the left femur neck and a T-score of -1.3 in the total femur.  Left forearm density was normal.  Annual mammograms have not revealed any evidence of malignancy.  Oncology History  Malignant neoplasm of central portion of left female breast (HCC)  11/01/2018 Initial Diagnosis   Breast cancer in female [facility])   11/01/2018 Cancer Staging   Staging form: Breast, AJCC 8th Edition - Clinical stage from 11/01/2018: Stage IA (cT1a, cN0, cM0, G1, ER+, PR+, HER2-) - Signed by Nolia Baumgartner, MD on 10/21/2020 Histopathologic type: Infiltrating duct carcinoma, NOS Stage prefix: Initial diagnosis Method of lymph node assessment: Clinical Nuclear grade: G1 Multigene prognostic tests performed: None Menopausal status: Postmenopausal Ki-67 (%): 5 Stage used in treatment planning: Yes National guidelines used in treatment planning: Yes Type of national guideline used in treatment planning: NCCN Staging comments: Central left breast     INTERVAL HISTORY:  Nichole Gray is here today for repeat clinical assessment for her stage IA hormone receptor positive breast cancer. Patient states that she feels *** and ***.      She denies signs of infection such as sore throat, sinus drainage, cough, or urinary symptoms.  She denies fevers or recurrent chills. She denies pain. She denies nausea,  vomiting, chest pain, dyspnea or cough. Her appetite is *** and her weight {Weight change:10426}.    She states she continues anastrozole daily without significant difficulty.  She denies any changes in her breasts.  She has intermittent joint pain and upper abdominal pain.  She fell in March and fractured her right radial  head.  This is healing well and she is having occasional moderate pain of the elbow.  She denies fevers or chills.  Her appetite is good. Her weight has increased 3 pounds over last year .  Prior to her visit today, she underwent bilateral screening mammogram.  REVIEW OF SYSTEMS:  Review of Systems  Constitutional:  Negative for appetite change, chills, fatigue, fever and unexpected weight change.  HENT:  Negative.  Negative for lump/mass, mouth sores and sore throat.   Eyes: Negative.   Respiratory: Negative.  Negative for chest tightness, cough, hemoptysis, shortness of breath and wheezing.   Cardiovascular: Negative.  Negative for chest pain, leg swelling and palpitations.  Gastrointestinal: Negative.  Negative for abdominal distention, abdominal pain, blood in stool, constipation, diarrhea, nausea and vomiting.  Endocrine: Negative.  Negative for hot flashes.  Genitourinary: Negative.  Negative for difficulty urinating, dysuria, frequency and hematuria.   Musculoskeletal:  Positive for arthralgias. Negative for back pain, flank pain, gait problem and myalgias.  Skin: Negative.  Negative for rash.  Neurological:  Negative for dizziness, extremity weakness, gait problem, headaches, light-headedness, numbness, seizures and speech difficulty.  Hematological: Negative.  Negative for adenopathy. Does not bruise/bleed easily.  Psychiatric/Behavioral: Negative.  Negative for depression and sleep disturbance. The patient is not nervous/anxious.      VITALS:  There were no vitals taken for this visit.  Wt Readings from Last 3 Encounters:  12/12/22 142 lb 3.2 oz (64.5 kg)  12/10/21 139 lb 14.4 oz (63.5 kg)  06/11/21 140 lb 4.8 oz (63.6 kg)    There is no height or weight on file to calculate BMI.  Performance status (ECOG): 1 - Symptomatic but completely ambulatory  PHYSICAL EXAM:  Physical Exam Vitals and nursing note reviewed.  Constitutional:      General: She is not in acute  distress.    Appearance: Normal appearance. She is normal weight. She is not ill-appearing, toxic-appearing or diaphoretic.  HENT:     Head: Normocephalic and atraumatic.     Right Ear: Tympanic membrane, ear canal and external ear normal. There is no impacted cerumen.     Left Ear: Tympanic membrane, ear canal and external ear normal. There is no impacted cerumen.     Nose: Nose normal. No congestion or rhinorrhea.     Mouth/Throat:     Mouth: Mucous membranes are moist.     Pharynx: Oropharynx is clear. No oropharyngeal exudate or posterior oropharyngeal erythema.  Eyes:     General: No scleral icterus.       Right eye: No discharge.        Left eye: No discharge.     Extraocular Movements: Extraocular movements intact.     Conjunctiva/sclera: Conjunctivae normal.     Pupils: Pupils are equal, round, and reactive to light.  Cardiovascular:     Rate and Rhythm: Normal rate and regular rhythm.     Pulses: Normal pulses.     Heart sounds: Normal heart sounds. No murmur heard.    No friction rub. No gallop.  Pulmonary:     Effort: Pulmonary effort is normal. No respiratory distress.     Breath sounds: Normal  breath sounds. No wheezing, rhonchi or rales.  Chest:  Breasts:    Right: Normal. No inverted nipple, mass, nipple discharge or skin change.     Left: Normal. No inverted nipple, mass, nipple discharge or skin change.  Abdominal:     General: Bowel sounds are normal. There is no distension.     Palpations: Abdomen is soft. There is no hepatomegaly, splenomegaly or mass.     Tenderness: There is no abdominal tenderness. There is no right CVA tenderness, left CVA tenderness, guarding or rebound.     Hernia: No hernia is present.  Musculoskeletal:        General: Normal range of motion.     Cervical back: Normal range of motion and neck supple. No tenderness.     Right lower leg: No edema.     Left lower leg: No edema.  Lymphadenopathy:     Cervical: No cervical adenopathy.      Right cervical: No superficial, deep or posterior cervical adenopathy.    Left cervical: No superficial, deep or posterior cervical adenopathy.     Upper Body:     Right upper body: No supraclavicular, axillary or pectoral adenopathy.     Left upper body: No supraclavicular, axillary or pectoral adenopathy.     Lower Body: No right inguinal adenopathy. No left inguinal adenopathy.  Skin:    General: Skin is warm and dry.     Coloration: Skin is not jaundiced.     Findings: No rash.  Neurological:     General: No focal deficit present.     Mental Status: She is alert and oriented to person, place, and time. Mental status is at baseline.     Cranial Nerves: No cranial nerve deficit.  Psychiatric:        Mood and Affect: Mood normal.        Behavior: Behavior normal.        Thought Content: Thought content normal.        Judgment: Judgment normal.     LABS:  No results found for: "WBC", "HGB", "HCT", "MCV", "PLT" No results found for: "CREATININE", "BUN", "NA", "K", "CL", "CO2" No results found for: "PROT", "ALBUMIN", "AST", "ALT", "ALKPHOS", "BILITOT", "BILIDIR", "IBILI"  No results found for: "IRON", "TIBC", "FERRITIN" No results found for: "TSH", "T3TOTAL", "T4TOTAL", "THYROIDAB"  STUDIES:     HISTORY:   Past Medical History:  Diagnosis Date   Asthma    Benign essential hypertension    Elevated cholesterol    Gout    History of pneumonia    Idiopathic neuropathy    Insomnia    Osteopenia    Renal insufficiency    Vitamin D insufficiency     Past Surgical History:  Procedure Laterality Date   APPENDECTOMY     TONSILLECTOMY     VESICOVAGINAL FISTULA CLOSURE W/ TAH      Family History  Problem Relation Age of Onset   Coronary artery disease Mother    Stroke Other    Brain cancer Father    Emphysema Father    Rheum arthritis Sister    Diabetes Other    Heart disease Sister    Breast cancer Sister     Social History:  reports that she quit smoking about  29 years ago. Her smoking use included cigarettes. She started smoking about 39 years ago. She has a 5 pack-year smoking history. She has never used smokeless tobacco. She reports that she does not drink alcohol and does not  use drugs.The patient is alone today.  Allergies:  Allergies  Allergen Reactions   Codeine Nausea And Vomiting and Rash    Nausea, vomiting   Penicillins Rash    Rash, itch  Other reaction(s): Other  Pt doesn't remember reaction because it was long ago   Hydrocodone Rash    vomting   Prednisone Rash    Can tolerate medication but doesn't like taking it    Current Medications: Current Outpatient Medications  Medication Sig Dispense Refill   allopurinol (ZYLOPRIM) 100 MG tablet Take 100 mg by mouth 2 (two) times daily.     amLODipine (NORVASC) 10 MG tablet Take 10 mg by mouth daily.     anastrozole (ARIMIDEX) 1 MG tablet TAKE 1 TABLET EVERY DAY 90 tablet 3   AREXVY 120 MCG/0.5ML injection      BREZTRI AEROSPHERE 160-9-4.8 MCG/ACT AERO Inhale into the lungs.     cholecalciferol (VITAMIN D3) 25 MCG (1000 UNIT) tablet Take by mouth.     Cyanocobalamin (VITAMIN B-12 IJ) Inject 1,000 mcg as directed every 30 (thirty) days.     gabapentin (NEURONTIN) 300 MG capsule Take 600 mg by mouth. Take 2 tablet at bedtime.     ipratropium-albuterol (DUONEB) 0.5-2.5 (3) MG/3ML SOLN Take by nebulization.     lansoprazole (PREVACID) 30 MG capsule Take by mouth.     losartan (COZAAR) 50 MG tablet Take 50 mg by mouth daily.     MAGNESIUM PO Take by mouth.     Vitamin D, Ergocalciferol, (DRISDOL) 50000 UNITS CAPS capsule Take 1 capsule by mouth. Twice weekly     No current facility-administered medications for this visit.     ASSESSMENT & PLAN:  Assessment:  Stage IA hormone receptor positive left breast cancer  Diagnosed in 2020.  She remains without evidence of recurrence.  She knows to continue anastrozole for 1 more year.  She will be due for bone density scan in January.   We will plan to see her back in 1 year with bilateral diagnostic mammogram.  We will then transition her to long-term survivorship.  The patient understands the plans discussed today and is in agreement with them.  She knows to contact our office if she develops concerns prior to her next appointment.    Plan:    I provided 20 minutes of face-to-face time during this encounter and > 50% was spent counseling as documented under my assessment and plan.   Nolia Baumgartner, MD [facility] - A DEPT OF [facility] [address] [place] [address] Dept: [phone] Dept Fax: [phone]   No orders of the defined types were placed in this encounter.    I,Jasmine M Lassiter,acting as a scribe for Nolia Baumgartner, MD.,have documented all relevant documentation on the behalf of Nolia Baumgartner, MD,as directed by  Nolia Baumgartner, MD while in the presence of Nolia Baumgartner, MD.

## 2023-12-14 LAB — HM MAMMOGRAPHY

## 2023-12-15 ENCOUNTER — Inpatient Hospital Stay: Attending: Oncology | Admitting: Oncology

## 2023-12-15 DIAGNOSIS — C50112 Malignant neoplasm of central portion of left female breast: Secondary | ICD-10-CM

## 2023-12-21 ENCOUNTER — Telehealth: Payer: Self-pay

## 2023-12-24 ENCOUNTER — Encounter: Payer: Self-pay | Admitting: Hematology and Oncology

## 2024-01-14 ENCOUNTER — Telehealth: Payer: Self-pay | Admitting: Oncology

## 2024-01-14 NOTE — Progress Notes (Signed)
 [facility]  [address] [place],  [place]  [address] [phone]  Clinic Day: 01/19/2024  Referring physician: Russell Court, DO  CHIEF COMPLAINT:  CC: Stage IA hormone receptor positive breast cancer  Current Treatment: Surveillance  HISTORY OF PRESENT ILLNESS:  Nichole Gray is a 83 y.o. female with a history stage IA (T1a N0 M0) hormone receptor positive left breast cancer diagnosed in February 2020.  This was found on screening mammogram, which revealed a possible distortion in the left breast.  It was noted that her breasts were extremely dense.  Left diagnostic mammogram revealed possible persistent architectural distortion in the outer left breast at the middle depth, as well as a possible subtle architectural distortion in the retroareolar area.  Ultrasound of the left breast and axilla was negative.  Stereotactic biopsy revealed an invasive ductal carcinoma grade 1-2 as well as fibrocystic changes.  She was treated with lumpectomy in March 2020.  Final pathology revealed a 5 mm, grade 1, invasive ductal carcinoma.  Margins were clear.  Axilla was clinically negative.  Estrogen and progesterone receptors were positive and HER 2 negative.  Ki 67 was 5%.  There were also proliferative fibrocystic changes and pseudoangiomatous stromal hyperplasia.  In view of the coronavirus pandemic, Dr. Britta Candy placed her on anastrozole 1 mg daily in April. We began seeing her in May, at which time she was tolerating anastrozole without significant difficulty.  Bone density scan in May 2020 revealed osteopenia with a T-score of -1.3 in the right femur and a T-score of -0.4 in the left forearm, for which she is taking calcium and vitamin-D.  She had abdominal pain and saw Dr. Monico Anna. CT abdomen and pelvis in March 2021 did not reveal any acute abnormality or other explanation for her pain.  She did not have continued abdominal pain.  Bronchiectasis was seen in the bilateral  lungs.  She sees Dr. Lala Pili in pulmonology.  She feels a globus sensation in her throat and was evaluated by ENT with apparent normal findings.  CT neck in October 2022 did not reveal an etiology.  Bone density scan in January 2023 revealed mild worsening of her osteopenia with a T-score of -1.7 in the left femur neck and a T-score of -1.3 in the total femur.  Left forearm density was normal.  Annual mammograms have not revealed any evidence of malignancy.  Oncology History  Malignant neoplasm of central portion of left female breast (HCC)  11/01/2018 Initial Diagnosis   Breast cancer in female [facility])   11/01/2018 Cancer Staging   Staging form: Breast, AJCC 8th Edition - Clinical stage from 11/01/2018: Stage IA (cT1a, cN0, cM0, G1, ER+, PR+, HER2-) - Signed by Nolia Baumgartner, MD on 10/21/2020 Histopathologic type: Infiltrating duct carcinoma, NOS Stage prefix: Initial diagnosis Method of lymph node assessment: Clinical Nuclear grade: G1 Multigene prognostic tests performed: None Menopausal status: Postmenopausal Ki-67 (%): 5 Stage used in treatment planning: Yes National guidelines used in treatment planning: Yes Type of national guideline used in treatment planning: NCCN Staging comments: Central left breast    INTERVAL HISTORY:  [name] is here today for repeat clinical assessment for her stage IA hormone receptor positive breast cancer. Patient states that she feels well and has no complaints of pain. She continues Anastrozole without difficulty which she has been taking now for 5 years. I informed her that she can stop this now or finish out her current prescription before stopping. Her screening bilateral mammogram done on  12/14/2023 was clear. Patient has had 4 falls within the last month and she was started on oral iron supplements as she was found to be anemic. Her PCP referred her to Dr. Monico Anna for further evaluation. She is past due for a bone density scan. I will schedule that  for her. I will see her back in 1 year with screening bilateral mammogram. She denies fever, chills, night sweats, or other signs of infection. She denies cardiorespiratory and gastrointestinal issues. She  denies pain. Her appetite is ok and Her weight has decreased 6 pounds over last year.    REVIEW OF SYSTEMS:  Review of Systems  Constitutional:  Negative for appetite change, chills, fatigue, fever and unexpected weight change.  HENT:  Negative.  Negative for lump/mass, mouth sores and sore throat.   Eyes: Negative.   Respiratory: Negative.  Negative for chest tightness, cough, hemoptysis, shortness of breath and wheezing.   Cardiovascular: Negative.  Negative for chest pain, leg swelling and palpitations.  Gastrointestinal: Negative.  Negative for abdominal distention, abdominal pain, blood in stool, constipation, diarrhea, nausea and vomiting.  Endocrine: Negative.  Negative for hot flashes.  Genitourinary: Negative.  Negative for difficulty urinating, dysuria, frequency and hematuria.   Musculoskeletal:  Positive for arthralgias. Negative for back pain, flank pain, gait problem and myalgias.  Skin: Negative.  Negative for rash.  Neurological:  Negative for dizziness, extremity weakness, gait problem, headaches, light-headedness, numbness, seizures and speech difficulty.  Hematological: Negative.  Negative for adenopathy. Does not bruise/bleed easily.  Psychiatric/Behavioral: Negative.  Negative for depression and sleep disturbance. The patient is not nervous/anxious.      VITALS:  Blood pressure 115/63, pulse 82, temperature 97.7 F (36.5 C), temperature source Oral, resp. rate 18, height 5' 5.2" (1.656 m), weight 136 lb 8 oz (61.9 kg), SpO2 97%.  Wt Readings from Last 3 Encounters:  01/19/24 136 lb 8 oz (61.9 kg)  12/12/22 142 lb 3.2 oz (64.5 kg)  12/10/21 139 lb 14.4 oz (63.5 kg)    Body mass index is 22.58 kg/m.  Performance status (ECOG): 1 - Symptomatic but completely  ambulatory  PHYSICAL EXAM:  Physical Exam Vitals and nursing note reviewed.  Constitutional:      General: She is not in acute distress.    Appearance: Normal appearance. She is normal weight. She is not ill-appearing, toxic-appearing or diaphoretic.  HENT:     Head: Normocephalic and atraumatic.     Right Ear: Tympanic membrane, ear canal and external ear normal. There is no impacted cerumen.     Left Ear: Tympanic membrane, ear canal and external ear normal. There is no impacted cerumen.     Nose: Nose normal. No congestion or rhinorrhea.     Mouth/Throat:     Mouth: Mucous membranes are moist.     Pharynx: Oropharynx is clear. No oropharyngeal exudate or posterior oropharyngeal erythema.  Eyes:     General: No scleral icterus.       Right eye: No discharge.        Left eye: No discharge.     Extraocular Movements: Extraocular movements intact.     Conjunctiva/sclera: Conjunctivae normal.     Pupils: Pupils are equal, round, and reactive to light.  Neck:     Vascular: No carotid bruit.  Cardiovascular:     Rate and Rhythm: Normal rate and regular rhythm.     Pulses: Normal pulses.     Heart sounds: Normal heart sounds. No murmur heard.    No  friction rub. No gallop.  Pulmonary:     Effort: Pulmonary effort is normal. No respiratory distress.     Breath sounds: Normal breath sounds. No stridor. No wheezing, rhonchi or rales.  Chest:     Chest wall: No tenderness.  Breasts:    Right: Normal. No inverted nipple, mass, nipple discharge or skin change.     Left: Normal. No inverted nipple, mass, nipple discharge or skin change.     Comments: Bilateral breasts are without masses Abdominal:     General: Bowel sounds are normal. There is no distension.     Palpations: Abdomen is soft. There is no hepatomegaly, splenomegaly or mass.     Tenderness: There is no abdominal tenderness. There is no right CVA tenderness, left CVA tenderness, guarding or rebound.     Hernia: No hernia is  present.  Musculoskeletal:        General: No swelling, tenderness, deformity or signs of injury. Normal range of motion.     Cervical back: Normal range of motion and neck supple. No rigidity or tenderness.     Right lower leg: No edema.     Left lower leg: No edema.  Lymphadenopathy:     Cervical: No cervical adenopathy.     Right cervical: No superficial, deep or posterior cervical adenopathy.    Left cervical: No superficial, deep or posterior cervical adenopathy.     Upper Body:     Right upper body: No supraclavicular, axillary or pectoral adenopathy.     Left upper body: No supraclavicular, axillary or pectoral adenopathy.     Lower Body: No right inguinal adenopathy. No left inguinal adenopathy.  Skin:    General: Skin is warm and dry.     Coloration: Skin is not jaundiced or pale.     Findings: No bruising, erythema, lesion or rash.  Neurological:     General: No focal deficit present.     Mental Status: She is alert and oriented to person, place, and time. Mental status is at baseline.     Cranial Nerves: No cranial nerve deficit.     Sensory: No sensory deficit.     Motor: No weakness.     Coordination: Coordination normal.     Gait: Gait normal.     Deep Tendon Reflexes: Reflexes normal.  Psychiatric:        Mood and Affect: Mood normal.        Behavior: Behavior normal.        Thought Content: Thought content normal.        Judgment: Judgment normal.    LABS:       No data to display         STUDIES:     HISTORY:   Past Medical History:  Diagnosis Date   Asthma    Benign essential hypertension    Elevated cholesterol    Gout    History of pneumonia    Idiopathic neuropathy    Insomnia    Osteopenia    Renal insufficiency    Vitamin D insufficiency     Past Surgical History:  Procedure Laterality Date   APPENDECTOMY     TONSILLECTOMY     VESICOVAGINAL FISTULA CLOSURE W/ TAH      Family History  Problem Relation Age of Onset   Coronary  artery disease Mother    Stroke Other    Brain cancer Father    Emphysema Father    Rheum arthritis Sister  Diabetes Other    Heart disease Sister    Breast cancer Sister     Social History:  reports that she quit smoking about 29 years ago. Her smoking use included cigarettes. She started smoking about 39 years ago. She has a 5 pack-year smoking history. She has never used smokeless tobacco. She reports that she does not drink alcohol and does not use drugs.The patient is alone today.  Allergies:  Allergies  Allergen Reactions   Codeine Nausea And Vomiting and Rash    Nausea, vomiting   Penicillins Rash    Rash, itch  Other reaction(s): Other  Pt doesn't remember reaction because it was long ago   Hydrocodone Rash    vomting   Prednisone Rash and Dermatitis    Can tolerate medication but doesn't like taking it  Can tolerate medication but doesn't like taking it, keeps her awake at night    Current Medications: Current Outpatient Medications  Medication Sig Dispense Refill   FEROSUL 325 (65 Fe) MG tablet Take 325 mg by mouth daily.     allopurinol (ZYLOPRIM) 100 MG tablet Take 100 mg by mouth 2 (two) times daily.     anastrozole (ARIMIDEX) 1 MG tablet TAKE 1 TABLET EVERY DAY 90 tablet 3   AREXVY 120 MCG/0.5ML injection      BREZTRI AEROSPHERE 160-9-4.8 MCG/ACT AERO Inhale into the lungs.     cholecalciferol (VITAMIN D3) 25 MCG (1000 UNIT) tablet Take by mouth.     Cyanocobalamin (VITAMIN B-12 IJ) Inject 1,000 mcg as directed every 30 (thirty) days.     gabapentin (NEURONTIN) 300 MG capsule Take 600 mg by mouth. Take 2 tablet at bedtime.     ipratropium-albuterol (DUONEB) 0.5-2.5 (3) MG/3ML SOLN Take by nebulization.     lansoprazole (PREVACID) 30 MG capsule Take by mouth.     losartan (COZAAR) 50 MG tablet Take 50 mg by mouth daily.     MAGNESIUM PO Take by mouth.     Vitamin D, Ergocalciferol, (DRISDOL) 50000 UNITS CAPS capsule Take 1 capsule by mouth. Twice weekly      No current facility-administered medications for this visit.   ASSESSMENT & PLAN:  Assessment:  Stage IA hormone receptor positive left breast cancer  Diagnosed in 2020.  She remains without evidence of recurrence. She has now completed 5 years of anastrozole and can stop it.  She she is past due for a bone density scan.  We will plan to see her back in 1 year with bilateral diagnostic mammogram.  We will then transition her to long-term survivorship.    Anemia Her PCP is taking care of this with oral iron and has referred her to Dr. Monico Anna for further evaluation.   Plan: She continues Anastrozole without difficulty which she has been taking now for 5 years. I informed her that she can stop this now or finish out her current prescription before stopping. Her screening bilateral mammogram done on 12/14/2023 was clear. Patient has had 4 falls within the last month and she was started on oral iron supplements as she was found to be anemic. Her PCP referred her to Dr. Monico Anna for further evaluation. She is past due for a bone density scan and I will schedule that for her. I will see her back in 1 year with screening bilateral mammogram. The patient understands the plans discussed today and is in agreement with them.  She knows to contact our office if she develops concerns prior to her next appointment.  I provided 16 minutes of face-to-face time during this encounter and > 50% was spent counseling as documented under my assessment and plan.   Nolia Baumgartner, MD  [facility] [address] [place] [address] Dept: [phone] Dept Fax: [phone]   Orders Placed This Encounter  Procedures   MM Digital Screening    Standing Status:   Future    Expected Date:   12/14/2024    Expiration Date:   01/18/2025    Reason for Exam (SYMPTOM  OR DIAGNOSIS REQUIRED):   screen    Preferred imaging  location?:   [facility]    I,Jasmine M Lassiter,acting as a scribe for Nolia Baumgartner, MD.,have documented all relevant documentation on the behalf of Nolia Baumgartner, MD,as directed by  Nolia Baumgartner, MD while in the presence of Nolia Baumgartner, MD.

## 2024-01-14 NOTE — Telephone Encounter (Signed)
 Patient has been scheduled for follow-up visit per 01/13/24 LOS.  Pt aware of scheduled appt details.

## 2024-01-19 ENCOUNTER — Telehealth: Payer: Self-pay | Admitting: Oncology

## 2024-01-19 ENCOUNTER — Other Ambulatory Visit: Payer: Self-pay | Admitting: Oncology

## 2024-01-19 ENCOUNTER — Inpatient Hospital Stay: Attending: Oncology | Admitting: Oncology

## 2024-01-19 VITALS — BP 115/63 | HR 82 | Temp 97.7°F | Resp 18 | Ht 65.2 in | Wt 136.5 lb

## 2024-01-19 DIAGNOSIS — Z170 Estrogen receptor positive status [ER+]: Secondary | ICD-10-CM

## 2024-01-19 DIAGNOSIS — Z808 Family history of malignant neoplasm of other organs or systems: Secondary | ICD-10-CM | POA: Insufficient documentation

## 2024-01-19 DIAGNOSIS — D649 Anemia, unspecified: Secondary | ICD-10-CM | POA: Insufficient documentation

## 2024-01-19 DIAGNOSIS — Z853 Personal history of malignant neoplasm of breast: Secondary | ICD-10-CM | POA: Insufficient documentation

## 2024-01-19 DIAGNOSIS — M8589 Other specified disorders of bone density and structure, multiple sites: Secondary | ICD-10-CM | POA: Diagnosis not present

## 2024-01-19 DIAGNOSIS — Z803 Family history of malignant neoplasm of breast: Secondary | ICD-10-CM | POA: Diagnosis not present

## 2024-01-19 DIAGNOSIS — C50112 Malignant neoplasm of central portion of left female breast: Secondary | ICD-10-CM

## 2024-01-19 DIAGNOSIS — Z87891 Personal history of nicotine dependence: Secondary | ICD-10-CM | POA: Diagnosis not present

## 2024-01-19 DIAGNOSIS — Z1239 Encounter for other screening for malignant neoplasm of breast: Secondary | ICD-10-CM

## 2024-01-19 NOTE — Telephone Encounter (Signed)
 Patient has been scheduled for follow-up visit per 01/19/24 LOS.  Pt given an appt calendar with date and time.

## 2024-01-26 ENCOUNTER — Encounter: Payer: Self-pay | Admitting: Oncology

## 2024-03-30 ENCOUNTER — Other Ambulatory Visit: Payer: Self-pay | Admitting: Oncology

## 2024-04-04 DIAGNOSIS — D509 Iron deficiency anemia, unspecified: Secondary | ICD-10-CM | POA: Diagnosis not present

## 2024-04-04 DIAGNOSIS — E538 Deficiency of other specified B group vitamins: Secondary | ICD-10-CM | POA: Diagnosis not present

## 2024-04-11 DIAGNOSIS — K219 Gastro-esophageal reflux disease without esophagitis: Secondary | ICD-10-CM | POA: Diagnosis not present

## 2024-04-12 DIAGNOSIS — J441 Chronic obstructive pulmonary disease with (acute) exacerbation: Secondary | ICD-10-CM | POA: Diagnosis not present

## 2024-04-12 DIAGNOSIS — Z6822 Body mass index (BMI) 22.0-22.9, adult: Secondary | ICD-10-CM | POA: Diagnosis not present

## 2024-04-12 DIAGNOSIS — R0602 Shortness of breath: Secondary | ICD-10-CM | POA: Diagnosis not present

## 2024-04-19 DIAGNOSIS — A310 Pulmonary mycobacterial infection: Secondary | ICD-10-CM | POA: Diagnosis not present

## 2024-04-19 DIAGNOSIS — Z6822 Body mass index (BMI) 22.0-22.9, adult: Secondary | ICD-10-CM | POA: Diagnosis not present

## 2024-04-19 DIAGNOSIS — R0602 Shortness of breath: Secondary | ICD-10-CM | POA: Diagnosis not present

## 2024-04-19 DIAGNOSIS — J441 Chronic obstructive pulmonary disease with (acute) exacerbation: Secondary | ICD-10-CM | POA: Diagnosis not present

## 2024-04-21 DIAGNOSIS — I5033 Acute on chronic diastolic (congestive) heart failure: Secondary | ICD-10-CM | POA: Diagnosis not present

## 2024-04-21 DIAGNOSIS — Z170 Estrogen receptor positive status [ER+]: Secondary | ICD-10-CM | POA: Diagnosis not present

## 2024-04-21 DIAGNOSIS — R053 Chronic cough: Secondary | ICD-10-CM | POA: Diagnosis not present

## 2024-04-21 DIAGNOSIS — G4733 Obstructive sleep apnea (adult) (pediatric): Secondary | ICD-10-CM | POA: Diagnosis not present

## 2024-04-21 DIAGNOSIS — C50912 Malignant neoplasm of unspecified site of left female breast: Secondary | ICD-10-CM | POA: Diagnosis not present

## 2024-04-21 DIAGNOSIS — J479 Bronchiectasis, uncomplicated: Secondary | ICD-10-CM | POA: Diagnosis not present

## 2024-04-21 DIAGNOSIS — I4891 Unspecified atrial fibrillation: Secondary | ICD-10-CM | POA: Diagnosis not present

## 2024-04-21 DIAGNOSIS — N183 Chronic kidney disease, stage 3 unspecified: Secondary | ICD-10-CM | POA: Diagnosis not present

## 2024-04-27 ENCOUNTER — Telehealth: Payer: Self-pay | Admitting: Pharmacist

## 2024-04-27 NOTE — Progress Notes (Signed)
   04/27/2024  Patient ID: Velia Arloa Sever, female   DOB: [birth year], 83 y.o.   MRN: [id number]  Telephonic engagement with Mrs. Roesler today regarding patient assistance renewal for [employer] 160/9/4.8 mcg/act. Appears that patient was not renewed for assistance program for 2025. Patient reports no changes in income or household size. Will come to office next week to sign AZ&Me application for re-enrollment.  Will send to [facility] Patient Advocate, Inocente Church, CPhT to coordinate application.   Annabella Galla, PharmD Clinical Pharmacist [place] Direct Dial: [phone]

## 2024-04-28 ENCOUNTER — Telehealth: Payer: Self-pay

## 2024-04-28 NOTE — Telephone Encounter (Signed)
 Completed patient assistance application for Breztri with AZ&ME and emailed application to Tiffany at [facility] to have patient and provider sign at upcoming appointment.

## 2024-04-30 DIAGNOSIS — R251 Tremor, unspecified: Secondary | ICD-10-CM | POA: Diagnosis not present

## 2024-05-02 DIAGNOSIS — R002 Palpitations: Secondary | ICD-10-CM | POA: Diagnosis not present

## 2024-05-02 DIAGNOSIS — D509 Iron deficiency anemia, unspecified: Secondary | ICD-10-CM | POA: Diagnosis not present

## 2024-05-02 DIAGNOSIS — Z6822 Body mass index (BMI) 22.0-22.9, adult: Secondary | ICD-10-CM | POA: Diagnosis not present

## 2024-05-02 DIAGNOSIS — J449 Chronic obstructive pulmonary disease, unspecified: Secondary | ICD-10-CM | POA: Diagnosis not present

## 2024-05-05 DIAGNOSIS — E538 Deficiency of other specified B group vitamins: Secondary | ICD-10-CM | POA: Diagnosis not present

## 2024-05-05 NOTE — Telephone Encounter (Signed)
 PAP: Application for Nichole Gray has been submitted to [employer] (AZ&Me), via fax

## 2024-05-06 NOTE — Telephone Encounter (Signed)
 PAP: Patient assistance application for Breztri has been approved by PAP Companies: AZ[employer]ME from 05/06/2024 to 08/24/2025. Medication should be delivered to PAP Delivery: Home. For further shipping updates, please contact [employer] (AZ[employer]Me) at [phone]. Patient ID is: [id number]

## 2024-05-06 NOTE — Progress Notes (Signed)
 Pharmacy Medication Assistance Program Note    05/06/2024  Patient ID: Nichole Gray, female   DOB: 1940/11/26, 83 y.o.   MRN: [id number]     04/28/2024  Outreach [employer]  Initial Outreach Date ([employer]) 04/28/2024  Manufacturer Medication One [employer]  [employer] [facility] Bretztri  Type of Assistance Manufacturer Assistance  Date Application Sent to Patient 04/28/2024  Application Items Requested Application  Date Application Sent to Prescriber 04/28/2024  Name of Prescriber [place]  Date Application Received From Patient 05/05/2024  Application Items Received From Patient Application  Date Application Received From Provider 05/05/2024  Method Application Sent to Manufacturer Fax  Patient Assistance Determination Approved  Approval Start Date 05/06/2024  Approval End Date 08/24/2025  Patient Notification Method Telephone Call  Telephone Call Outcome Left Voicemail     Signature

## 2024-05-21 DIAGNOSIS — J189 Pneumonia, unspecified organism: Secondary | ICD-10-CM | POA: Diagnosis not present

## 2024-05-21 DIAGNOSIS — J155 Pneumonia due to Escherichia coli: Secondary | ICD-10-CM | POA: Diagnosis not present

## 2024-05-21 DIAGNOSIS — Z1623 Resistance to quinolones and fluoroquinolones: Secondary | ICD-10-CM | POA: Diagnosis not present

## 2024-05-21 DIAGNOSIS — N1831 Chronic kidney disease, stage 3a: Secondary | ICD-10-CM | POA: Diagnosis not present

## 2024-05-21 DIAGNOSIS — A310 Pulmonary mycobacterial infection: Secondary | ICD-10-CM | POA: Diagnosis not present

## 2024-05-21 DIAGNOSIS — A419 Sepsis, unspecified organism: Secondary | ICD-10-CM | POA: Diagnosis not present

## 2024-05-21 DIAGNOSIS — J471 Bronchiectasis with (acute) exacerbation: Secondary | ICD-10-CM | POA: Diagnosis not present

## 2024-05-21 DIAGNOSIS — J9601 Acute respiratory failure with hypoxia: Secondary | ICD-10-CM | POA: Diagnosis not present

## 2024-05-21 DIAGNOSIS — R1030 Lower abdominal pain, unspecified: Secondary | ICD-10-CM | POA: Diagnosis not present

## 2024-05-21 DIAGNOSIS — J470 Bronchiectasis with acute lower respiratory infection: Secondary | ICD-10-CM | POA: Diagnosis not present

## 2024-05-21 DIAGNOSIS — R002 Palpitations: Secondary | ICD-10-CM | POA: Diagnosis not present

## 2024-05-21 DIAGNOSIS — G629 Polyneuropathy, unspecified: Secondary | ICD-10-CM | POA: Diagnosis not present

## 2024-05-21 DIAGNOSIS — E7800 Pure hypercholesterolemia, unspecified: Secondary | ICD-10-CM | POA: Diagnosis not present

## 2024-05-21 DIAGNOSIS — R197 Diarrhea, unspecified: Secondary | ICD-10-CM | POA: Diagnosis not present

## 2024-05-21 DIAGNOSIS — J209 Acute bronchitis, unspecified: Secondary | ICD-10-CM | POA: Diagnosis not present

## 2024-05-21 DIAGNOSIS — N189 Chronic kidney disease, unspecified: Secondary | ICD-10-CM | POA: Diagnosis not present

## 2024-05-21 DIAGNOSIS — J411 Mucopurulent chronic bronchitis: Secondary | ICD-10-CM | POA: Diagnosis not present

## 2024-05-21 DIAGNOSIS — R918 Other nonspecific abnormal finding of lung field: Secondary | ICD-10-CM | POA: Diagnosis not present

## 2024-05-21 DIAGNOSIS — I129 Hypertensive chronic kidney disease with stage 1 through stage 4 chronic kidney disease, or unspecified chronic kidney disease: Secondary | ICD-10-CM | POA: Diagnosis not present

## 2024-05-23 DIAGNOSIS — R918 Other nonspecific abnormal finding of lung field: Secondary | ICD-10-CM | POA: Diagnosis not present

## 2024-05-23 DIAGNOSIS — J189 Pneumonia, unspecified organism: Secondary | ICD-10-CM | POA: Diagnosis not present

## 2024-05-23 DIAGNOSIS — J9601 Acute respiratory failure with hypoxia: Secondary | ICD-10-CM | POA: Diagnosis not present

## 2024-05-23 DIAGNOSIS — J209 Acute bronchitis, unspecified: Secondary | ICD-10-CM | POA: Diagnosis not present

## 2024-05-24 DIAGNOSIS — J9601 Acute respiratory failure with hypoxia: Secondary | ICD-10-CM | POA: Diagnosis not present

## 2024-05-24 DIAGNOSIS — J189 Pneumonia, unspecified organism: Secondary | ICD-10-CM | POA: Diagnosis not present

## 2024-05-24 DIAGNOSIS — R918 Other nonspecific abnormal finding of lung field: Secondary | ICD-10-CM | POA: Diagnosis not present

## 2024-05-24 DIAGNOSIS — J209 Acute bronchitis, unspecified: Secondary | ICD-10-CM | POA: Diagnosis not present

## 2024-05-28 DIAGNOSIS — R002 Palpitations: Secondary | ICD-10-CM | POA: Diagnosis not present

## 2024-06-06 DIAGNOSIS — E538 Deficiency of other specified B group vitamins: Secondary | ICD-10-CM | POA: Diagnosis not present

## 2024-06-06 DIAGNOSIS — J449 Chronic obstructive pulmonary disease, unspecified: Secondary | ICD-10-CM | POA: Diagnosis not present

## 2024-06-07 DIAGNOSIS — R918 Other nonspecific abnormal finding of lung field: Secondary | ICD-10-CM | POA: Diagnosis not present

## 2024-06-07 DIAGNOSIS — G4733 Obstructive sleep apnea (adult) (pediatric): Secondary | ICD-10-CM | POA: Diagnosis not present

## 2024-06-07 DIAGNOSIS — J449 Chronic obstructive pulmonary disease, unspecified: Secondary | ICD-10-CM | POA: Diagnosis not present

## 2024-06-07 DIAGNOSIS — J479 Bronchiectasis, uncomplicated: Secondary | ICD-10-CM | POA: Diagnosis not present

## 2024-07-13 DIAGNOSIS — J479 Bronchiectasis, uncomplicated: Secondary | ICD-10-CM | POA: Diagnosis not present

## 2024-07-13 DIAGNOSIS — G4733 Obstructive sleep apnea (adult) (pediatric): Secondary | ICD-10-CM | POA: Diagnosis not present

## 2024-07-13 DIAGNOSIS — R918 Other nonspecific abnormal finding of lung field: Secondary | ICD-10-CM | POA: Diagnosis not present

## 2024-07-13 DIAGNOSIS — J449 Chronic obstructive pulmonary disease, unspecified: Secondary | ICD-10-CM | POA: Diagnosis not present

## 2024-07-20 DIAGNOSIS — J479 Bronchiectasis, uncomplicated: Secondary | ICD-10-CM | POA: Diagnosis not present

## 2024-07-20 DIAGNOSIS — G4733 Obstructive sleep apnea (adult) (pediatric): Secondary | ICD-10-CM | POA: Diagnosis not present

## 2024-07-20 DIAGNOSIS — E538 Deficiency of other specified B group vitamins: Secondary | ICD-10-CM | POA: Diagnosis not present

## 2024-07-20 DIAGNOSIS — J441 Chronic obstructive pulmonary disease with (acute) exacerbation: Secondary | ICD-10-CM | POA: Diagnosis not present

## 2024-07-20 DIAGNOSIS — I83899 Varicose veins of unspecified lower extremities with other complications: Secondary | ICD-10-CM | POA: Diagnosis not present

## 2024-07-20 DIAGNOSIS — J449 Chronic obstructive pulmonary disease, unspecified: Secondary | ICD-10-CM | POA: Diagnosis not present

## 2024-07-26 DIAGNOSIS — D649 Anemia, unspecified: Secondary | ICD-10-CM | POA: Diagnosis not present

## 2024-07-26 DIAGNOSIS — D509 Iron deficiency anemia, unspecified: Secondary | ICD-10-CM | POA: Diagnosis not present

## 2024-08-02 DIAGNOSIS — K219 Gastro-esophageal reflux disease without esophagitis: Secondary | ICD-10-CM | POA: Diagnosis not present

## 2024-12-21 ENCOUNTER — Encounter: Admitting: Hematology and Oncology
